# Patient Record
Sex: Male | Born: 2000 | Race: Black or African American | Hispanic: No | Marital: Single | Smoking: Never smoker
Health system: Southern US, Community
[De-identification: ages and names within clinical notes are randomized; demographics above are authoritative.]

## PROBLEM LIST (undated history)

## (undated) DIAGNOSIS — J45909 Unspecified asthma, uncomplicated: Secondary | ICD-10-CM

## (undated) DIAGNOSIS — J40 Bronchitis, not specified as acute or chronic: Secondary | ICD-10-CM

---
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
## 2019-08-11 ENCOUNTER — Emergency Department (HOSPITAL_COMMUNITY)
Admission: EM | Admit: 2019-08-11 | Discharge: 2019-08-11 | Disposition: A | Discharge status: Home / Self Care | Payer: 59 | Attending: Emergency Medicine | Admitting: Emergency Medicine

## 2019-08-11 ENCOUNTER — Emergency Department (HOSPITAL_COMMUNITY): Payer: 59

## 2019-08-11 ENCOUNTER — Other Ambulatory Visit: Payer: Self-pay

## 2019-08-11 DIAGNOSIS — J9801 Acute bronchospasm: Secondary | ICD-10-CM | POA: Insufficient documentation

## 2019-08-11 DIAGNOSIS — R0602 Shortness of breath: Secondary | ICD-10-CM | POA: Diagnosis not present

## 2019-08-11 DIAGNOSIS — J40 Bronchitis, not specified as acute or chronic: Secondary | ICD-10-CM | POA: Insufficient documentation

## 2019-08-11 DIAGNOSIS — R0789 Other chest pain: Secondary | ICD-10-CM | POA: Diagnosis present

## 2019-08-11 DIAGNOSIS — J209 Acute bronchitis, unspecified: Secondary | ICD-10-CM

## 2019-08-11 LAB — BASIC METABOLIC PANEL
Anion gap: 13 (ref 5–15)
BUN: 11 mg/dL (ref 6–20)
CO2: 24 mmol/L (ref 22–32)
Calcium: 9.2 mg/dL (ref 8.9–10.3)
Chloride: 102 mmol/L (ref 98–111)
Creatinine, Ser: 1.23 mg/dL (ref 0.61–1.24)
GFR calc Af Amer: >60 mL/min (ref >60)
GFR calc non Af Amer: >60 mL/min (ref >60)
Glucose, Bld: 90 mg/dL (ref 70–99)
Potassium: 4 mmol/L (ref 3.5–5.1)
Sodium: 139 mmol/L (ref 135–145)

## 2019-08-11 LAB — CBC
HCT: 52.3 % — ABNORMAL HIGH (ref 39.0–52.0)
Hemoglobin: 17.2 g/dL — ABNORMAL HIGH (ref 13.0–17.0)
MCH: 33 pg (ref 26.0–34.0)
MCHC: 32.9 g/dL (ref 30.0–36.0)
MCV: 100.2 fL — ABNORMAL HIGH (ref 80.0–100.0)
Platelets: 227 10*3/uL (ref 150–400)
RBC: 5.22 MIL/uL (ref 4.22–5.81)
RDW: 12.4 % (ref 11.5–15.5)
WBC: 10.8 10*3/uL — ABNORMAL HIGH (ref 4.0–10.5)
nRBC: 0 % (ref 0.0–0.2)

## 2019-08-11 LAB — TROPONIN I (HIGH SENSITIVITY): Troponin I (High Sensitivity): <2 ng/L (ref <18)

## 2019-08-11 MED ORDER — AEROCHAMBER Z-STAT PLUS/MEDIUM MISC
1.0000 | Freq: Once | Status: AC
  Administered 2019-08-11: 1
  Filled 2019-08-11: qty 1

## 2019-08-11 MED ORDER — PREDNISONE 20 MG PO TABS
60.0000 mg | ORAL_TABLET | Freq: Once | ORAL | Status: AC
  Administered 2019-08-11: 06:00:00 60 mg via ORAL
  Filled 2019-08-11: qty 3

## 2019-08-11 MED ORDER — SODIUM CHLORIDE 0.9% FLUSH: 3.0000 mL | Freq: Once | INTRAVENOUS | Status: DC

## 2019-08-11 MED ORDER — ALBUTEROL SULFATE HFA 108 (90 BASE) MCG/ACT IN AERS
6.0000 | INHALATION_SPRAY | Freq: Once | RESPIRATORY_TRACT | Status: AC
  Administered 2019-08-11: 06:00:00 6 via RESPIRATORY_TRACT
  Filled 2019-08-11: qty 6.7

## 2019-08-11 MED ORDER — AZITHROMYCIN 250 MG PO TABS: ORAL_TABLET | ORAL | 0 refills | Status: DC

## 2019-08-11 MED ORDER — PREDNISONE 20 MG PO TABS: ORAL_TABLET | ORAL | 0 refills | Status: DC

## 2019-08-11 NOTE — ED Triage Notes (Signed)
Arrived POV from home. Patient reports chest pain that worsens when taking a deep breath.

## 2019-08-11 NOTE — ED Provider Notes (Signed)
[place] DEPT Provider Note   CSN: [id number] Arrival date & time: 08/11/19  4098   Time seen 05:10 AM  History Chief Complaint  Patient presents with  . Chest Pain    Joshua Marks is a 19 y.o. male.  HPI   Patient states about 1130 last night he was in bed trying to go to sleep and he had some discomfort in the center of his chest when he breathes deep.  He feels like he is struggling to breathe.  He states he has had a mild cough for a few days.  He denies fever, sore throat, rhinorrhea, nausea, vomiting, or any known injury to his chest.  He does play football.  He denies any increased stress.  He states he is never had this before.  His mother is here with him and verifies he has never had asthma even as a child.  There is no family history of asthma.  No past medical history on file.  Patient Active Problem List   Diagnosis Date Noted  . Chest pain 02/03/2018  . Snoring 10/23/2012    No past surgical history on file.     No family history on file.  Social History   Tobacco Use  . Smoking status: Never Smoker  . Smokeless tobacco: Never Used  Substance Use Topics  . Alcohol use: Not on file  . Drug use: Not on file  College student  Home Medications Prior to Admission medications   Medication Sig Start Date End Date Taking? Authorizing Provider  azithromycin (ZITHROMAX) 250 MG tablet Take 2 po the first day then once a day for the next 4 days. 08/11/19   Rolland Porter, MD  predniSONE (DELTASONE) 20 MG tablet Take 3 po QD x 3d , then 2 po QD x 3d then 1 po QD x 3d 08/11/19   Rolland Porter, MD  none  Allergies    Patient has no known allergies.  Review of Systems   Review of Systems  All other systems reviewed and are negative.   Physical Exam Updated Vital Signs BP 138/78 (BP Location: Left Arm)   Pulse 75   Temp 97.8 F (36.6 C)   Resp 20   SpO2 100%   Vital signs normal    Physical Exam Vitals and nursing note  reviewed.  Constitutional:      Appearance: Normal appearance. He is normal weight.  HENT:     Head: Normocephalic and atraumatic.     Right Ear: External ear normal.     Left Ear: External ear normal.     Nose: Nose normal.     Mouth/Throat:     Mouth: Mucous membranes are moist.     Pharynx: No oropharyngeal exudate or posterior oropharyngeal erythema.  Eyes:     Extraocular Movements: Extraocular movements intact.     Conjunctiva/sclera: Conjunctivae normal.     Pupils: Pupils are equal, round, and reactive to light.  Cardiovascular:     Rate and Rhythm: Normal rate and regular rhythm.  Pulmonary:     Effort: Pulmonary effort is normal. Prolonged expiration present. No tachypnea, accessory muscle usage, respiratory distress or retractions.     Breath sounds: Wheezing and rhonchi present.  Musculoskeletal:     Cervical back: Normal range of motion.  Neurological:     Mental Status: He is alert.     ED Results / Procedures / Treatments   Labs (all labs ordered are listed, but only abnormal results are  displayed) Results for orders placed or performed during the hospital encounter of 08/11/19  Basic metabolic panel  Result Value Ref Range   Sodium 139 135 - 145 mmol/L   Potassium 4.0 3.5 - 5.1 mmol/L   Chloride 102 98 - 111 mmol/L   CO2 24 22 - 32 mmol/L   Glucose, Bld 90 70 - 99 mg/dL   BUN 11 6 - 20 mg/dL   Creatinine, Ser 5.46 0.61 - 1.24 mg/dL   Calcium 9.2 8.9 - 27.0 mg/dL   GFR calc non Af Amer >60 >60 mL/min   GFR calc Af Amer >60 >60 mL/min   Anion gap 13 5 - 15  CBC  Result Value Ref Range   WBC 10.8 (H) 4.0 - 10.5 K/uL   RBC 5.22 4.22 - 5.81 MIL/uL   Hemoglobin 17.2 (H) 13.0 - 17.0 g/dL   HCT 35.0 (H) 09.3 - 81.8 %   MCV 100.2 (H) 80.0 - 100.0 fL   MCH 33.0 26.0 - 34.0 pg   MCHC 32.9 30.0 - 36.0 g/dL   RDW 29.9 37.1 - 69.6 %   Platelets 227 150 - 400 K/uL   nRBC 0.0 0.0 - 0.2 %  Troponin I (High Sensitivity)  Result Value Ref Range   Troponin I  (High Sensitivity) <2 <18 ng/L   Laboratory interpretation all normal except mild leukocytosis    EKG EKG Interpretation  Date/Time:  Tuesday August 11 2019 05:23:59 EDT Ventricular Rate:  72 PR Interval:    QRS Duration: 118 QT Interval:  378 QTC Calculation: 414 R Axis:   82 Text Interpretation: Sinus rhythm Borderline short PR interval Incomplete right bundle branch block No significant change since last tracing 03 Feb 2018 Confirmed by Devoria Albe ([id number]) on 08/11/2019 5:34:31 AM   Radiology DG Chest 2 View  Result Date: 08/11/2019 CLINICAL DATA:  Chest pain and shortness of breath EXAM: CHEST - 2 VIEW COMPARISON:  None. FINDINGS: The heart size and mediastinal contours are within normal limits. Both lungs are clear. The visualized skeletal structures are unremarkable. IMPRESSION: No active cardiopulmonary disease. Electronically Signed   By: Jonna Clark M.D.   On: 08/11/2019 05:25    Procedures Procedures (including critical care time)  Medications Ordered in ED Medications  sodium chloride flush (NS) 0.9 % injection 3 mL (3 mLs Intravenous Not Given 08/11/19 0509)  aerochamber Z-Stat Plus/medium 1 each (has no administration in time range)  albuterol (VENTOLIN HFA) 108 (90 Base) MCG/ACT inhaler 6 puff (6 puffs Inhalation Given 08/11/19 0538)  predniSONE (DELTASONE) tablet 60 mg (60 mg Oral Given 08/11/19 0537)    ED Course  I have reviewed the triage vital signs and the nursing notes.  Pertinent labs & imaging results that were available during my care of the patient were reviewed by me and considered in my medical decision making (see chart for details).    MDM Rules/Calculators/A&P                      Patient was given albuterol inhaler 6 puffs for his wheezing.  After reviewing his chest x-ray he was given prednisone 60 mg orally.  Recheck at 6:30 AM patient is sleeping in no distress.  When I listen to him now the wheezing and rhonchi is gone.  He states he feels  much better.  Mother would like him to be tested for Covid while he is here.  They were given a spacer to use with the inhaler when  he goes home.   Final Clinical Impression(s) / ED Diagnoses Final diagnoses:  Bronchitis with bronchospasm    Rx / DC Orders ED Discharge Orders         Ordered    predniSONE (DELTASONE) 20 MG tablet     08/11/19 0640    azithromycin (ZITHROMAX) 250 MG tablet     08/11/19 0640          Plan discharge  Devoria Albe, MD, Concha Pyo, MD 08/11/19 [phone]

## 2019-08-11 NOTE — Discharge Instructions (Addendum)
Use the inhaler 2 puffs every 4-6 hours as needed for wheezing or shortness of breath.  Use the spacer to make it more effective.  Take the prednisone until gone.  Recheck if you get a high fever or you get worse.

## 2019-10-25 ENCOUNTER — Emergency Department (HOSPITAL_COMMUNITY): Payer: No Typology Code available for payment source

## 2019-10-25 ENCOUNTER — Emergency Department (HOSPITAL_COMMUNITY)
Admission: EM | Admit: 2019-10-25 | Discharge: 2019-10-25 | Disposition: A | Discharge status: Home / Self Care | Payer: No Typology Code available for payment source | Attending: Emergency Medicine | Admitting: Emergency Medicine

## 2019-10-25 ENCOUNTER — Encounter (HOSPITAL_COMMUNITY): Payer: Self-pay | Admitting: Emergency Medicine

## 2019-10-25 ENCOUNTER — Other Ambulatory Visit: Payer: Self-pay

## 2019-10-25 DIAGNOSIS — J4520 Mild intermittent asthma, uncomplicated: Secondary | ICD-10-CM | POA: Insufficient documentation

## 2019-10-25 DIAGNOSIS — R0602 Shortness of breath: Secondary | ICD-10-CM | POA: Diagnosis present

## 2019-10-25 DIAGNOSIS — R0789 Other chest pain: Secondary | ICD-10-CM | POA: Insufficient documentation

## 2019-10-25 HISTORY — DX: Bronchitis, not specified as acute or chronic: J40

## 2019-10-25 LAB — CBC
HCT: 52.7 % — ABNORMAL HIGH (ref 39.0–52.0)
Hemoglobin: 17.4 g/dL — ABNORMAL HIGH (ref 13.0–17.0)
MCH: 32.2 pg (ref 26.0–34.0)
MCHC: 33 g/dL (ref 30.0–36.0)
MCV: 97.6 fL (ref 80.0–100.0)
Platelets: 196 10*3/uL (ref 150–400)
RBC: 5.4 MIL/uL (ref 4.22–5.81)
RDW: 12.4 % (ref 11.5–15.5)
WBC: 9.8 10*3/uL (ref 4.0–10.5)
nRBC: 0 % (ref 0.0–0.2)

## 2019-10-25 LAB — TROPONIN I (HIGH SENSITIVITY): Troponin I (High Sensitivity): 3 ng/L (ref <18)

## 2019-10-25 LAB — BASIC METABOLIC PANEL
Anion gap: 11 (ref 5–15)
BUN: 13 mg/dL (ref 6–20)
CO2: 25 mmol/L (ref 22–32)
Calcium: 9.3 mg/dL (ref 8.9–10.3)
Chloride: 103 mmol/L (ref 98–111)
Creatinine, Ser: 1.34 mg/dL — ABNORMAL HIGH (ref 0.61–1.24)
GFR calc Af Amer: >60 mL/min (ref >60)
GFR calc non Af Amer: >60 mL/min (ref >60)
Glucose, Bld: 92 mg/dL (ref 70–99)
Potassium: 4.2 mmol/L (ref 3.5–5.1)
Sodium: 139 mmol/L (ref 135–145)

## 2019-10-25 MED ORDER — SODIUM CHLORIDE 0.9% FLUSH: 3.0000 mL | Freq: Once | INTRAVENOUS | Status: DC

## 2019-10-25 MED ORDER — PREDNISONE 20 MG PO TABS
ORAL_TABLET | ORAL | 0 refills | Status: DC
Start: 2019-10-25 — End: 2020-05-09

## 2019-10-25 MED ORDER — PREDNISONE 20 MG PO TABS
60.0000 mg | ORAL_TABLET | Freq: Once | ORAL | Status: AC
  Administered 2019-10-25: 60 mg via ORAL
  Filled 2019-10-25: qty 3

## 2019-10-25 MED ORDER — ALBUTEROL SULFATE HFA 108 (90 BASE) MCG/ACT IN AERS
6.0000 | INHALATION_SPRAY | Freq: Once | RESPIRATORY_TRACT | Status: AC
  Administered 2019-10-25: 6 via RESPIRATORY_TRACT
  Filled 2019-10-25: qty 6.7

## 2019-10-25 NOTE — ED Triage Notes (Signed)
C/o cough, SOB and pain to center of chest since last night.

## 2019-10-25 NOTE — ED Provider Notes (Signed)
   19 year old male presents with past 2days prior.  No known history of asthma.  No fever chills runny nose sneezing sore throat loss of taste or smell.  He has had his Covid vaccination.  He has no significant cardiac disease.  5:10 PM EKG without concerning changes.  Creatinine is 1.3.,  It appears patient has had abnormal renal function from prior lab work as well.  I discussed with patient the importance of follow-up with a nephrologist for further management and evaluation of his impaired kidney function.  Negative troponin, chest x-ray unremarkable, patient without hypoxia he is stable for discharge.  BP 140/81   Pulse 78   Temp 98.2 F (36.8 C)   Resp 18   Ht 6\' 2"  (1.88 m)   Wt 88.5 kg   SpO2 95%   BMI 25.04 kg/m   Results for orders placed or performed during the hospital encounter of 10/25/19  Basic metabolic panel  Result Value Ref Range   Sodium 139 135 - 145 mmol/L   Potassium 4.2 3.5 - 5.1 mmol/L   Chloride 103 98 - 111 mmol/L   CO2 25 22 - 32 mmol/L   Glucose, Bld 92 70 - 99 mg/dL   BUN 13 6 - 20 mg/dL   Creatinine, Ser 12/25/19 (H) 0.61 - 1.24 mg/dL   Calcium 9.3 8.9 - 9.81 mg/dL   GFR calc non Af Amer >60 >60 mL/min   GFR calc Af Amer >60 >60 mL/min   Anion gap 11 5 - 15  CBC  Result Value Ref Range   WBC 9.8 4.0 - 10.5 K/uL   RBC 5.40 4.22 - 5.81 MIL/uL   Hemoglobin 17.4 (H) 13.0 - 17.0 g/dL   HCT 19.1 (H) 47.8 - 29.5 %   MCV 97.6 80.0 - 100.0 fL   MCH 32.2 26.0 - 34.0 pg   MCHC 33.0 30.0 - 36.0 g/dL   RDW 62.1 30.8 - 65.7 %   Platelets 196 150 - 400 K/uL   nRBC 0.0 0.0 - 0.2 %  Troponin I (High Sensitivity)  Result Value Ref Range   Troponin I (High Sensitivity) 3 <18 ng/L   DG Chest 2 View  Result Date: 10/25/2019 CLINICAL DATA:  Chest pain, shortness of breath EXAM: CHEST - 2 VIEW COMPARISON:  08/11/2019 FINDINGS: Normal heart size, mediastinal contours, and pulmonary vascularity. Lungs clear. No pleural effusion or pneumothorax. Bones unremarkable.  IMPRESSION: Normal exam. Electronically Signed   By: 08/13/2019 M.D.   On: 10/25/2019 13:44      12/25/2019, PA-C 10/25/19 1710    12/25/19, MD 10/25/19 2101

## 2019-10-25 NOTE — ED Notes (Signed)
Patient verbalizes understanding of discharge instructions. Opportunity for questioning and answers were provided. Armband removed by staff, pt discharged from ED.  

## 2019-10-25 NOTE — ED Provider Notes (Signed)
[facility] EMERGENCY DEPARTMENT Provider Note   CSN: [id number] Arrival date & time: 10/25/19  1252     History Chief Complaint  Patient presents with  . Shortness of Breath  . Chest Pain    Joshua Marks is a 19 y.o. male with past medical history significant for bronchitis presents to emergency department today with chief complaint of progressively worsening shortness of breath and chest pain x2 days.  Patient states the pain started last night in his chest while he was sitting down at rest.  He states the pain is located in the center of his chest.  He states pain has been constant and is an aching sensation.  He describes the shortness of breath as feeling as if he is struggling to breathe.  He hears wheezing.  He has not tried any medications for symptoms prior to arrival.  He denies any fever, chills, sore throat, rhinorrhea, cough, hemoptysis, nausea, vomiting, loss of sense of taste or smell, rash.  He denies any sick contacts.  Patient admits to occasional tobacco abuse and states he is not an every day smoker.  He denies any history of asthma.  Also denies any family history of MI. Patient does state he was seen for similar symptoms on 08/11/2019.  Past Medical History:  Diagnosis Date  . Bronchitis     Patient Active Problem List   Diagnosis Date Noted  . Chest pain 02/03/2018  . Snoring 10/23/2012    History reviewed. No pertinent surgical history.     No family history on file.  Social History   Tobacco Use  . Smoking status: Never Smoker  . Smokeless tobacco: Never Used  Substance Use Topics  . Alcohol use: Never  . Drug use: Yes    Types: Marijuana    Home Medications Prior to Admission medications   Medication Sig Start Date End Date Taking? Authorizing Provider  azithromycin (ZITHROMAX) 250 MG tablet Take 2 po the first day then once a day for the next 4 days. 08/11/19   Devoria Albe, MD  predniSONE (DELTASONE) 20 MG tablet Take 3 po QD x  3d , then 2 po QD x 3d then 1 po QD x 3d 08/11/19   Devoria Albe, MD    Allergies    Patient has no known allergies.  Review of Systems   Review of Systems  All other systems are reviewed and are negative for acute change except as noted in the HPI.   Physical Exam Updated Vital Signs BP 136/84   Pulse 66   Temp 98.2 F (36.8 C)   Resp 19   Ht 6\' 2"  (1.88 m)   Wt 88.5 kg   SpO2 95%   BMI 25.04 kg/m   Physical Exam Vitals and nursing note reviewed.  Constitutional:      General: He is not in acute distress.    Appearance: He is not ill-appearing.  HENT:     Head: Normocephalic and atraumatic.     Right Ear: Tympanic membrane and external ear normal.     Left Ear: Tympanic membrane and external ear normal.     Nose: Nose normal.     Mouth/Throat:     Mouth: Mucous membranes are moist.     Pharynx: Oropharynx is clear.  Eyes:     General: No scleral icterus.       Right eye: No discharge.        Left eye: No discharge.     Extraocular  Movements: Extraocular movements intact.     Conjunctiva/sclera: Conjunctivae normal.     Pupils: Pupils are equal, round, and reactive to light.  Neck:     Vascular: No JVD.  Cardiovascular:     Rate and Rhythm: Normal rate and regular rhythm.     Pulses: Normal pulses.          Radial pulses are 2+ on the right side and 2+ on the left side.     Heart sounds: Normal heart sounds.  Pulmonary:     Comments: Expiratory wheeze heard in all lung fields.  Symmetric chest rise.  No rales or rhonchi heard.  Patient is talking in full sentences.  Oxygen saturation is 95% on room air.  Symmetric chest rise. Chest:     Chest wall: No tenderness.  Abdominal:     Comments: Abdomen is soft, non-distended, and non-tender in all quadrants. No rigidity, no guarding. No peritoneal signs.  Musculoskeletal:        General: Normal range of motion.     Cervical back: Normal range of motion.     Right lower leg: No edema.     Left lower leg: No edema.    Skin:    General: Skin is warm and dry.     Capillary Refill: Capillary refill takes less than 2 seconds.  Neurological:     Mental Status: He is oriented to person, place, and time.     GCS: GCS eye subscore is 4. GCS verbal subscore is 5. GCS motor subscore is 6.     Comments: Fluent speech, no facial droop.  Psychiatric:        Behavior: Behavior normal.     ED Results / Procedures / Treatments   Labs (all labs ordered are listed, but only abnormal results are displayed) Labs Reviewed  CBC - Abnormal; Notable for the following components:      Result Value   Hemoglobin 17.4 (*)    HCT 52.7 (*)    All other components within normal limits  BASIC METABOLIC PANEL  TROPONIN I (HIGH SENSITIVITY)  TROPONIN I (HIGH SENSITIVITY)    EKG EKG Interpretation  Date/Time:  Sunday October 25 2019 13:03:56 EDT Ventricular Rate:  72 PR Interval:  126 QRS Duration: 102 QT Interval:  360 QTC Calculation: 394 R Axis:   93 Text Interpretation: Normal sinus rhythm with sinus arrhythmia Rightward axis Incomplete right bundle branch block Borderline ECG Confirmed by Carmin Muskrat [phone]) on 10/25/2019 3:44:58 PM   Radiology DG Chest 2 View  Result Date: 10/25/2019 CLINICAL DATA:  Chest pain, shortness of breath EXAM: CHEST - 2 VIEW COMPARISON:  08/11/2019 FINDINGS: Normal heart size, mediastinal contours, and pulmonary vascularity. Lungs clear. No pleural effusion or pneumothorax. Bones unremarkable. IMPRESSION: Normal exam. Electronically Signed   By: Lavonia Dana M.D.   On: 10/25/2019 13:44    Procedures Procedures (including critical care time)  Medications Ordered in ED Medications  sodium chloride flush (NS) 0.9 % injection 3 mL (has no administration in time range)  predniSONE (DELTASONE) tablet 60 mg (has no administration in time range)  albuterol (VENTOLIN HFA) 108 (90 Base) MCG/ACT inhaler 6 puff (has no administration in time range)    ED Course  I have reviewed the triage  vital signs and the nursing notes.  Pertinent labs & imaging results that were available during my care of the patient were reviewed by me and considered in my medical decision making (see chart for details).  MDM Rules/Calculators/A&P                      History provided by patient with additional history obtained from chart review.    19 year old male presenting with chest pain shortness of breath.Vitals show he is afebrile, normotensive, no tachycardia or hypoxia.  On exam expiratory wheeze heard in all lung fields.  He does have normal work of breathing with symmetric chest rise.  He is speaking in full sentences, no obvious respiratory distress.  Oxygen saturation is 95% on room air during exam. Chest pain work-up was initiated in triage.  Chest x-ray was performed.  I viewed imaging which shows no acute infectious processes.  Labs show no leukocytosis, no anemia.  Troponin, BMP are pending.  EKG shows no ischemic changes compared to prior.  Will give p.o. prednisone and albuterol with plan to ambulate while checking oxygen saturation..  Patient denies family history of MI.  Presentation is not suggestive of ACS.  Also not suggestive of PE.  Patient care transferred to B. Laveda Norman PA-C at the end of my shift pending labs and reassessment. Patient presentation, ED course, and plan of care discussed with review of all pertinent labs and imaging. Please see his note for further details regarding further ED course and disposition. Anticipate discharge home if ambulatory with hypoxia and symptoms improve.   Portions of this note were generated with Scientist, clinical (histocompatibility and immunogenetics). Dictation errors may occur despite best attempts at proofreading.   Final Clinical Impression(s) / ED Diagnoses Final diagnoses:  None    Rx / DC Orders ED Discharge Orders    None       Kathyrn Lass 10/25/19 1620    Gerhard Munch, MD 10/25/19 2101

## 2019-10-25 NOTE — Discharge Instructions (Addendum)
No shortness of breath and wheezing may be due to asthma.  Please use albuterol inhaler 2 puffs every 4 hours as needed for shortness of breath.  Take steroids as prescribed.  Follow-up with your primary care provider for management.  Return if you have any concerns.  Your Kidney function is abnormal, it appears to show you have impaired kidney function from prior lab works as well.  Please consider follow-up with nephrologist for evaluation.

## 2020-05-09 ENCOUNTER — Encounter: Payer: Self-pay | Admitting: Family Medicine

## 2020-05-09 ENCOUNTER — Ambulatory Visit (INDEPENDENT_AMBULATORY_CARE_PROVIDER_SITE_OTHER): Payer: No Typology Code available for payment source | Admitting: Family Medicine

## 2020-05-09 ENCOUNTER — Other Ambulatory Visit: Payer: Self-pay

## 2020-05-09 DIAGNOSIS — J45909 Unspecified asthma, uncomplicated: Secondary | ICD-10-CM | POA: Insufficient documentation

## 2020-05-09 DIAGNOSIS — J4520 Mild intermittent asthma, uncomplicated: Secondary | ICD-10-CM

## 2020-05-09 DIAGNOSIS — R7989 Other specified abnormal findings of blood chemistry: Secondary | ICD-10-CM | POA: Diagnosis not present

## 2020-05-09 MED ORDER — ALBUTEROL SULFATE HFA 108 (90 BASE) MCG/ACT IN AERS: 2.0000 | INHALATION_SPRAY | RESPIRATORY_TRACT | 3 refills | Status: DC | PRN

## 2020-05-09 NOTE — Assessment & Plan Note (Signed)
Based on clinical history of wheezing, no cough, SOB with exertion without evidence of cardiac history or murmur on exam and improved respiratory symptoms with albuterol inhaler.  - prescribed albuterol inhaler for use PRN  - patient to follow up in 6 weeks  - scheduled for spirometry testing with Dr. Raymondo Band  - confirmed patient has received two doses of COVID vaccine  - precautions for repeated use of albuterol in setting of SOB/dyspnea/chest tightness that patient will need to be seen in ED

## 2020-05-09 NOTE — Progress Notes (Signed)
    SUBJECTIVE:   CHIEF COMPLAINT / HPI: asthma   Patient reports that he has some central chest pain and has some trouble breathing while trying to sleep. He denies any presence of chest pain/tightness today. This has been occurring intermittently since he was seen in the ED in June of this year. He reports being given an inhaler but uses it infrequently. He does have some SOB at times especially when playing sports or being active.   Sometimes has some wheezing  Reports smoking marijuana 3-4x per week  Denies tobacco use or vaping  Denies coughing, no dysphagia or GERD symptoms Never had spirometry testing  No history of passing out while playing sports and reports that his breathing feels better after using his albuterol and he is able to quickly return to play after using it  Denies any association of these symptoms with recent illness   Elevated creatinine  Patient noted to have elevated creatinine ranging from 1.2-1.3 in last three years. Specific mention of this during ED visit in June. Patient declines lab work today to check for updated value. Discussed importance and possible increased muscle mass as possible contributor to elevated level. Patient denies hematuria, oliguria, flank/CVA pain.   PERTINENT  PMH / PSH: Reactive Airway   OBJECTIVE:   BP 118/60   Pulse (!) 57   Ht 6' (1.829 m)   Wt 189 lb 6.4 oz (85.9 kg)   SpO2 99%   BMI 25.69 kg/m    General: male appearing stated age in no acute distress Cardio: Normal S1 and S2, no S3 or S4. Rhythm is regular. No murmurs or rubs.  Bilateral radial pulses palpable Pulm: Clear to auscultation bilaterally, no crackles, wheezing, or diminished breath sounds. Normal respiratory effort, stable on RA Abdomen: Bowel sounds normal. Abdomen soft and non-tender.  Extremities: No peripheral edema. Warm/ well perfused.    ASSESSMENT/PLAN:   Asthma Based on clinical history of wheezing, no cough, SOB with exertion without evidence of  cardiac history or murmur on exam and improved respiratory symptoms with albuterol inhaler.  - prescribed albuterol inhaler for use PRN  - patient to follow up in 6 weeks  - scheduled for spirometry testing with Dr. Raymondo Band  - confirmed patient has received two doses of COVID vaccine  - precautions for repeated use of albuterol in setting of SOB/dyspnea/chest tightness that patient will need to be seen in ED   Elevated serum creatinine Chart review with creatinine elevated from 1.2-1.3. Normal GFR. No hematuria/oliguria or signs of pyelonephritis. Potentially due to increased muscle mass as patient is of athletic build and lifts weights often.  - declines BMP & U/A today      Ronnald Ramp, MD [facility]

## 2020-05-09 NOTE — Assessment & Plan Note (Signed)
Chart review with creatinine elevated from 1.2-1.3. Normal GFR. No hematuria/oliguria or signs of pyelonephritis. Potentially due to increased muscle mass as patient is of athletic build and lifts weights often.  - declines BMP & U/A today

## 2020-05-09 NOTE — Patient Instructions (Addendum)
I have prescribed an albuterol inhaler for you to use before exercise and when you have increased wheezing.   I have scheduled you for the breathing test to see if you lungs show evidence of changes consistent with asthma.     Asthma and Physical Activity Physical activity is an important part of a healthy lifestyle. If you have asthma, it is important to exercise because physical activity can help you to:  Control your asthma.  Maintain your weight or lose weight.  Increase your energy.  Decrease stress and anxiety.  Lower your risk of getting sick.  Improve your heart health. However, asthma symptoms can flare up when you are physically active or exercising. You can learn how to control your asthma and prevent symptoms during exercise. This will help you to remain physically active. How can asthma affect my ability to be physically active? When you have asthma, physical activity can cause you to have symptoms such as:  Wheezing. This may sound like whistling while breathing.  A feeling of tightness in the chest.  Sore throat.  Coughing.  Shortness of breath.  Tiredness (fatigue) with minimal activity.  Increased sputum production.  Chest pain. What actions can I take to prevent asthma problems during physical activity? Pulmonary rehabilitation Enroll in a pulmonary rehabilitation program. Benefits of this type of program include:  Education on lung diseases.  Classes that teach you how to exercise and be more active while decreasing your shortness of breath.  A group setting that allows you to talk with others who have asthma. Asthma action plan Follow the asthma action plan set by your health care provider. Your personal asthma plan may include:  Taking your medicines as told by your health care provider.  Avoiding your asthma triggers, except physical activity. Triggers may include cold air, dust, pollen, pet dander, and air pollution.  Tracking your asthma  control.  Using a peak flow meter.  Being aware of worsening symptoms.  Knowing when to seek emergency care. Proper breathing During exercise, follow these tips for proper breathing:  Breathe in before starting the exercise and breathe out during the hardest part of the exercise.  Take slow breaths.  Pace yourself and do not try to go too fast.  While breathing out, purse your lips. Before beginning any exercise program or new activity, talk with your health care provider. Medicines If physical activity triggers your asthma, your health care provider may order the following medicines:  A rescue inhaler (short-acting beta2-agonist) for you to use shortly before physical activity or exercise. Its effects may reduce exercise-related symptoms for 2-3 hours.  A long-acting beta2-agonist that can offer up to 12 hours of relief if taken daily.  Leukotriene modifiers. These pills are taken several hours before physical activity or exercise to help prevent asthma symptoms that are caused by exercise.  Long-term control medicines. These will be given if you have severe or frequent asthma symptoms during or after exercise. These symptoms may also mean that your asthma is not well controlled.  General information  Exercise indoors when the air is dry or during allergy season.  Try to breathe in warm, moist air by wearing a scarf over your nose and mouth or breathing only through your nose.  Spend a few minutes warming up before your workout.  Cool down after exercise. What should I do if my asthma symptoms get worse? Contact your health care provider if your asthma symptoms are getting worse. Your asthma is getting worse if:  You have symptoms more often.  Your symptoms are more severe.  Your symptoms get worse at night and make you lose sleep.  Your peak flow number is lower than your personal best or changes a lot from day to day.  Your asthma medicines do not work as well as  they used to.  You use your rescue inhaler more often. If you use your rescue inhaler more than 2 days a week, your asthma is not well controlled.  You go to the emergency room or see your health care provider because of an asthma attack. Where to find support  Ask your health care provider about signing up for a pulmonary rehabilitation program.  Ask your health care provider about asthma support groups.  Visit your local community health department.  Check out local hospitals' community health programs. Where can I get more information?  Your health care provider.  American Lung Association: lung.org  National Heart, Lung, and Blood Institute: [employer] Contact a health care provider if:  You have trouble walking and talking because you are out of breath. Get help right away if:  Your lips or fingernails are blue.  You are not able to breathe or catch your breath. Summary  Physical activity is an important part of a healthy lifestyle. However, if you have asthma, your symptoms can flare up during exercise or physical activity.  You can prevent problems during physical activity by doing pulmonary rehabilitation, following an asthma action plan, doing proper breathing, and using medicines.  Talk with your health care provider before starting any exercise program or new activity. This information is not intended to replace advice given to you by your health care provider. Make sure you discuss any questions you have with your health care provider. Document Revised: 08/26/2018 Document Reviewed: 07/23/2017 Elsevier Patient Education  2020 [employer].

## 2020-05-26 ENCOUNTER — Ambulatory Visit: Payer: No Typology Code available for payment source | Admitting: Pharmacist

## 2020-09-23 ENCOUNTER — Encounter: Payer: Self-pay | Admitting: Family Medicine

## 2020-09-23 ENCOUNTER — Ambulatory Visit (INDEPENDENT_AMBULATORY_CARE_PROVIDER_SITE_OTHER): Payer: No Typology Code available for payment source | Admitting: Family Medicine

## 2020-09-23 ENCOUNTER — Other Ambulatory Visit (HOSPITAL_COMMUNITY)
Admission: RE | Admit: 2020-09-23 | Discharge: 2020-09-23 | Disposition: A | Discharge status: Home / Self Care | Payer: No Typology Code available for payment source | Source: Ambulatory Visit | Attending: Family Medicine | Admitting: Family Medicine

## 2020-09-23 ENCOUNTER — Other Ambulatory Visit: Payer: Self-pay

## 2020-09-23 VITALS — BP 120/72 | HR 59 | Ht 73.5 in | Wt 194.4 lb

## 2020-09-23 DIAGNOSIS — R7989 Other specified abnormal findings of blood chemistry: Secondary | ICD-10-CM

## 2020-09-23 DIAGNOSIS — Z7251 High risk heterosexual behavior: Secondary | ICD-10-CM | POA: Diagnosis not present

## 2020-09-23 DIAGNOSIS — Z7689 Persons encountering health services in other specified circumstances: Secondary | ICD-10-CM | POA: Diagnosis present

## 2020-09-23 DIAGNOSIS — J4520 Mild intermittent asthma, uncomplicated: Secondary | ICD-10-CM

## 2020-09-23 NOTE — Patient Instructions (Signed)
It was a pleasure to see you today!  Thank you for choosing [facility] for your primary care.   Joshua Marks was seen for annual physical.   Our plans for today were:  STI screening: We will complete STI screening today with blood test and urine tests. I will let you know of any abnormal results.   Healthcare maintenance: we will also do blood work to check your kidney levels as they were elevated slightly last year.   To keep you healthy, please keep in mind the following health maintenance items that you are due for:   1. Tetanus Vaccine    You should return to our clinic as needed.   Best Wishes,   Dr. Neita Garnet

## 2020-09-23 NOTE — Progress Notes (Signed)
    SUBJECTIVE:   CHIEF COMPLAINT / HPI: physical and STI screening   Patient reports that he has had a new male sexual partner and did not use barrier contraception. He denies any penile dyscharge, dysuria, fever, chills, joint pain, or new genital/groin rashes. Patient reports feeling well and having no complaints today but would like to have STI testing for reassurance.   Asthma Patient denies any nighttime cough. He uses rescue inhaler after playing football or basketball occasionally. He denies presence of wheezing or coughing.    PERTINENT  PMH / PSH:   OBJECTIVE:   BP 120/72   Pulse (!) 59   Ht 6' 1.5" (1.867 m)   Wt 194 lb 6.4 oz (88.2 kg)   SpO2 95%   BMI 25.30 kg/m   General: male appearing stated age in no acute distress HEENT: MMM, Neck non-tender without lymphadenopathy, masses or thyromegaly Cardio: Normal S1 and S2, no S3 or S4. Rhythm is regular. No murmurs or rubs.  Bilateral radial pulses palpable Pulm: Clear to auscultation bilaterally, no crackles, wheezing, or diminished breath sounds. Normal respiratory effort, stable on RA Abdomen: Bowel sounds normal. Abdomen soft and non-tender.  Extremities: No peripheral edema. Warm/ well perfused.   ASSESSMENT/PLAN:   Asthma Refilled albuterol inhaler  Uses intermittently with exercised induced symptoms   Elevated serum creatinine Will recheck BMP for creatinine today   Unprotected sex HIV  RPR  Hep C  Urine GC       Ronnald Ramp, MD [facility]

## 2020-09-23 NOTE — Assessment & Plan Note (Signed)
Will recheck BMP for creatinine today

## 2020-09-23 NOTE — Assessment & Plan Note (Signed)
Refilled albuterol inhaler  Uses intermittently with exercised induced symptoms

## 2020-09-23 NOTE — Assessment & Plan Note (Signed)
HIV  RPR  Hep C  Urine GC

## 2020-09-24 LAB — BASIC METABOLIC PANEL
BUN/Creatinine Ratio: 11 (ref 9–20)
BUN: 14 mg/dL (ref 6–20)
CO2: 21 mmol/L (ref 20–29)
Calcium: 9.3 mg/dL (ref 8.7–10.2)
Chloride: 105 mmol/L (ref 96–106)
Creatinine, Ser: 1.3 mg/dL — ABNORMAL HIGH (ref 0.76–1.27)
Glucose: 84 mg/dL (ref 65–99)
Potassium: 4.7 mmol/L (ref 3.5–5.2)
Sodium: 142 mmol/L (ref 134–144)
eGFR: 81 mL/min/{1.73_m2} (ref >59)

## 2020-09-24 LAB — RPR: RPR Ser Ql: NONREACTIVE

## 2020-09-24 LAB — HEPATITIS C ANTIBODY: Hep C Virus Ab: <0.1 s/co ratio (ref 0.0–0.9)

## 2020-09-24 LAB — HIV ANTIBODY (ROUTINE TESTING W REFLEX): HIV Screen 4th Generation wRfx: NONREACTIVE

## 2020-09-25 MED ORDER — ALBUTEROL SULFATE HFA 108 (90 BASE) MCG/ACT IN AERS: 2.0000 | INHALATION_SPRAY | RESPIRATORY_TRACT | 3 refills | Status: DC | PRN

## 2020-09-26 ENCOUNTER — Encounter: Payer: Self-pay | Admitting: Family Medicine

## 2020-09-26 LAB — URINE CYTOLOGY ANCILLARY ONLY
Chlamydia: NEGATIVE
Comment: NEGATIVE
Comment: NORMAL
Neisseria Gonorrhea: NEGATIVE

## 2020-12-07 ENCOUNTER — Ambulatory Visit: Payer: No Typology Code available for payment source

## 2020-12-08 ENCOUNTER — Ambulatory Visit: Payer: No Typology Code available for payment source

## 2020-12-09 ENCOUNTER — Other Ambulatory Visit: Payer: Self-pay

## 2020-12-09 ENCOUNTER — Ambulatory Visit (INDEPENDENT_AMBULATORY_CARE_PROVIDER_SITE_OTHER): Payer: No Typology Code available for payment source

## 2020-12-09 DIAGNOSIS — Z23 Encounter for immunization: Secondary | ICD-10-CM | POA: Diagnosis not present

## 2020-12-09 DIAGNOSIS — Z111 Encounter for screening for respiratory tuberculosis: Secondary | ICD-10-CM

## 2020-12-12 ENCOUNTER — Ambulatory Visit (INDEPENDENT_AMBULATORY_CARE_PROVIDER_SITE_OTHER): Payer: No Typology Code available for payment source

## 2020-12-12 ENCOUNTER — Other Ambulatory Visit: Payer: Self-pay

## 2020-12-12 DIAGNOSIS — Z111 Encounter for screening for respiratory tuberculosis: Secondary | ICD-10-CM

## 2020-12-12 LAB — TB SKIN TEST
Induration: 0 mm
TB Skin Test: NEGATIVE

## 2020-12-12 NOTE — Progress Notes (Signed)
Patient is here for a PPD placement.  PPD placed in left forearm @ 1520.  Patient will return 12/12/20 to have PPD read.   Patient also requests updated vaccinations. Per [facility], patient is due for Tdap vaccination. Administered in RD, site unremarkable, patient tolerated injection well.   Veronda Prude, RN

## 2020-12-13 NOTE — Progress Notes (Signed)
Patient is here for a PPD read.  It was placed on 12/09/2020 in the left forearm @ 3:20 pm.    PPD RESULTS:  Result: negative Induration: 0 mm  Letter created and given to patient for documentation purposes. Veronda Prude, RN

## 2020-12-16 ENCOUNTER — Telehealth: Payer: Self-pay | Admitting: Family Medicine

## 2020-12-16 NOTE — Telephone Encounter (Signed)
Patients mother is calling and would like to schedule an appointment for patient to have sickle cell testing for football. His results are not in state records.  Molly Maduro said we could place lab but will need for Dr. Neita Garnet to place order before scheduling.   Please call mother at [phone] once order has been placed so that she can schedule.

## 2020-12-20 ENCOUNTER — Other Ambulatory Visit: Payer: Self-pay | Admitting: Family Medicine

## 2020-12-20 DIAGNOSIS — Z130 Encounter for screening for diseases of the blood and blood-forming organs and certain disorders involving the immune mechanism: Secondary | ICD-10-CM

## 2020-12-20 NOTE — Telephone Encounter (Signed)
Orders placed for sickle cell screening per mother's request.  Attempted to call with no answer.  Left voicemail for patient's mother to call clinic to schedule lab appointment.  Ronnald Ramp, MD [facility], PGY-3 [phone]

## 2020-12-21 ENCOUNTER — Other Ambulatory Visit: Payer: No Typology Code available for payment source

## 2020-12-22 ENCOUNTER — Other Ambulatory Visit: Payer: No Typology Code available for payment source

## 2020-12-26 ENCOUNTER — Other Ambulatory Visit: Payer: No Typology Code available for payment source

## 2020-12-26 ENCOUNTER — Other Ambulatory Visit: Payer: Self-pay

## 2020-12-26 DIAGNOSIS — Z130 Encounter for screening for diseases of the blood and blood-forming organs and certain disorders involving the immune mechanism: Secondary | ICD-10-CM

## 2020-12-28 ENCOUNTER — Encounter: Payer: Self-pay | Admitting: Family Medicine

## 2020-12-28 LAB — CBC
Hematocrit: 45.2 % (ref 37.5–51.0)
Hemoglobin: 15.8 g/dL (ref 13.0–17.7)
MCH: 32.3 pg (ref 26.6–33.0)
MCHC: 35 g/dL (ref 31.5–35.7)
MCV: 92 fL (ref 79–97)
Platelets: 197 10*3/uL (ref 150–450)
RBC: 4.89 x10E6/uL (ref 4.14–5.80)
RDW: 11.3 % — ABNORMAL LOW (ref 11.6–15.4)
WBC: 5 10*3/uL (ref 3.4–10.8)

## 2020-12-28 LAB — HGB FRACTIONATION CASCADE
Hgb A2: 2.3 % (ref 1.8–3.2)
Hgb A: 97.7 % (ref 96.4–98.8)
Hgb F: 0 % (ref 0.0–2.0)
Hgb S: 0 %

## 2020-12-28 LAB — SICKLE CELL SCREEN: Sickle Cell Screen: NEGATIVE

## 2021-03-28 ENCOUNTER — Other Ambulatory Visit: Payer: Self-pay

## 2021-03-28 ENCOUNTER — Ambulatory Visit (INDEPENDENT_AMBULATORY_CARE_PROVIDER_SITE_OTHER): Payer: 59 | Admitting: Family Medicine

## 2021-03-28 DIAGNOSIS — L639 Alopecia areata, unspecified: Secondary | ICD-10-CM | POA: Diagnosis not present

## 2021-03-28 NOTE — Assessment & Plan Note (Signed)
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  Discussed possible injections or high dose topical steroids but given risk of hypopigmentation decided to observe but to contact me if any changes

## 2021-03-28 NOTE — Patient Instructions (Addendum)
Good to see you today - Thank you for coming in  Things we discussed today:  Alopecia Areata  - let me know if getting worse or have another patch  See handout

## 2021-03-28 NOTE — Progress Notes (Signed)
    SUBJECTIVE:   CHIEF COMPLAINT / HPI:   Hair Loss Focal area of hair loss behind R ear.  Present for a month or so.  Doesn't seem to be changing.  No redness or scaling or itching. No history of chronic diseases or other skin rash or mouth sores or arthritis   OBJECTIVE:   BP 120/70   Pulse 68   Ht 6\' 2"  (1.88 m)   Wt 198 lb 3.2 oz (89.9 kg)   SpO2 96%   BMI 25.45 kg/m   Scalp is normal except for focal area of complete hair loss behind R ear.  See picture. 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  Discussed possible injections or high dose topical steroids but given risk of hypopigmentation decided to observe but to contact me if any changes      , MD [facility]

## 2021-07-24 ENCOUNTER — Telehealth: Payer: 59 | Admitting: Nurse Practitioner

## 2021-07-24 DIAGNOSIS — L249 Irritant contact dermatitis, unspecified cause: Secondary | ICD-10-CM | POA: Diagnosis not present

## 2021-07-24 MED ORDER — TRIAMCINOLONE ACETONIDE 0.1 % EX CREA: 1.0000 "application " | TOPICAL_CREAM | Freq: Two times a day (BID) | CUTANEOUS | 0 refills | Status: DC

## 2021-07-24 NOTE — Progress Notes (Signed)
?Virtual Visit Consent  ? ?Ashby Dawes, you are scheduled for a virtual visit with a [facility] provider today.   ?  ?Just as with appointments in the office, your consent must be obtained to participate.  Your consent will be active for this visit and any virtual visit you may have with one of our providers in the next 365 days.   ?  ?If you have a MyChart account, a copy of this consent can be sent to you electronically.  All virtual visits are billed to your insurance company just like a traditional visit in the office.   ? ?As this is a virtual visit, video technology does not allow for your provider to perform a traditional examination.  This may limit your provider's ability to fully assess your condition.  If your provider identifies any concerns that need to be evaluated in person or the need to arrange testing (such as labs, EKG, etc.), we will make arrangements to do so.   ?  ?Although advances in technology are sophisticated, we cannot ensure that it will always work on either your end or our end.  If the connection with a video visit is poor, the visit may have to be switched to a telephone visit.  With either a video or telephone visit, we are not always able to ensure that we have a secure connection.    ? ?I need to obtain your verbal consent now.   Are you willing to proceed with your visit today?  ?  ?Joshua Marks has provided verbal consent on 07/24/2021 for a virtual visit (video or telephone). ?  ?Viviano Simas, FNP  ? ?Date: 07/24/2021 2:37 PM ? ? ?Virtual Visit via Video Note  ? ?IViviano Simas, connected with  Joshua Marks  ([id number], 01-16-1990) on 07/24/21 at  2:45 PM EST by a video-enabled telemedicine application and verified that I am speaking with the correct person using two identifiers. ? ?Location: ?Patient: Virtual Visit Location Patient: Home ?Provider: Virtual Visit Location Provider: Home Office ?  ?I discussed the limitations of evaluation and management by telemedicine and the  availability of in person appointments. The patient expressed understanding and agreed to proceed.   ? ?History of Present Illness: ?Joshua Marks is a 21 y.o. who identifies as a male who was assigned male at birth, and is being seen today for a rash that he started to notice on the back of his head and neck after using a new product called "hims" that is for hair growth.  ? ?He has been using the new product for the past 1-2 weeks.  ?The rash appeared last week and has continued to spread down his neck.  ? ?He has also noticed the rash on his fingers where he touched the product to apply it to scalp.  ?Problems:  ?Patient Active Problem List  ? Diagnosis Date Noted  ? Alopecia areata 03/28/2021  ? Unprotected sex 09/23/2020  ? Asthma 05/09/2020  ? Elevated serum creatinine 05/09/2020  ?  ?Allergies: No Known Allergies ?Medications:  ?Current Outpatient Medications:  ?  albuterol (VENTOLIN HFA) 108 (90 Base) MCG/ACT inhaler, Inhale 2 puffs into the lungs every 4 (four) hours as needed for wheezing or shortness of breath., Disp: 1 each, Rfl: 3 ? ?Observations/Objective: ?Patient is well-developed, well-nourished in no acute distress.  ?Resting comfortably  at home.  ?Head is normocephalic, atraumatic.  ?No labored breathing.  ?Speech is clear and coherent with logical content.  ?Patient is alert and oriented  at baseline.  ?Papular rash to posterior neck just below hairline and to distal portions of fingers bilaterally without involvement of the palms.  ? ?Assessment and Plan: ?1. Irritant contact dermatitis, unspecified trigger ?Stop using product (hims) ?May use topical cream for up to 2 weeks: ?- triamcinolone cream (KENALOG) 0.1 %; Apply 1 application. topically 2 (two) times daily.  Dispense: 30 g; Refill: 0 ?   ?Claritin in the day and benadryl before bed as needed until rash/itching resolve ? ?Avoid hot showers  ? ? ?Follow Up Instructions: ?I discussed the assessment and treatment plan with the patient. The  patient was provided an opportunity to ask questions and all were answered. The patient agreed with the plan and demonstrated an understanding of the instructions.  A copy of instructions were sent to the patient via MyChart unless otherwise noted below.  ? ? ?The patient was advised to call back or seek an in-person evaluation if the symptoms worsen or if the condition fails to improve as anticipated. ? ?Time:  ?I spent 10 minutes with the patient via telehealth technology discussing the above problems/concerns.   ? ?Viviano Simas, FNP  ?

## 2021-07-25 ENCOUNTER — Ambulatory Visit: Payer: 59 | Admitting: Family Medicine

## 2021-07-25 NOTE — Progress Notes (Deleted)
? ? ?  SUBJECTIVE:  ? ?CHIEF COMPLAINT / HPI:  ? ?Alopecia ? ? ?PERTINENT  PMH / PSH: Asthma ? ? ?OBJECTIVE:  ? ?There were no vitals taken for this visit.  ?*** ? ?ASSESSMENT/PLAN:  ? ?No problem-specific Assessment & Plan notes found for this encounter. ?  ? ? ?Carney Living, MD ?[facility]  ?

## 2021-10-08 ENCOUNTER — Emergency Department (HOSPITAL_BASED_OUTPATIENT_CLINIC_OR_DEPARTMENT_OTHER)
Admission: EM | Admit: 2021-10-08 | Discharge: 2021-10-08 | Disposition: A | Discharge status: Home / Self Care | Payer: 59 | Attending: Emergency Medicine | Admitting: Emergency Medicine

## 2021-10-08 ENCOUNTER — Encounter (HOSPITAL_BASED_OUTPATIENT_CLINIC_OR_DEPARTMENT_OTHER): Payer: Self-pay | Admitting: Emergency Medicine

## 2021-10-08 ENCOUNTER — Emergency Department (HOSPITAL_BASED_OUTPATIENT_CLINIC_OR_DEPARTMENT_OTHER): Payer: 59 | Admitting: Radiology

## 2021-10-08 ENCOUNTER — Other Ambulatory Visit: Payer: Self-pay

## 2021-10-08 DIAGNOSIS — J4521 Mild intermittent asthma with (acute) exacerbation: Secondary | ICD-10-CM | POA: Insufficient documentation

## 2021-10-08 DIAGNOSIS — Z7951 Long term (current) use of inhaled steroids: Secondary | ICD-10-CM | POA: Insufficient documentation

## 2021-10-08 DIAGNOSIS — R531 Weakness: Secondary | ICD-10-CM | POA: Insufficient documentation

## 2021-10-08 DIAGNOSIS — Z20822 Contact with and (suspected) exposure to covid-19: Secondary | ICD-10-CM | POA: Diagnosis not present

## 2021-10-08 DIAGNOSIS — R509 Fever, unspecified: Secondary | ICD-10-CM | POA: Diagnosis present

## 2021-10-08 HISTORY — DX: Unspecified asthma, uncomplicated: J45.909

## 2021-10-08 LAB — RESP PANEL BY RT-PCR (FLU A&B, COVID) ARPGX2
Influenza A by PCR: NEGATIVE
Influenza B by PCR: NEGATIVE
SARS Coronavirus 2 by RT PCR: NEGATIVE

## 2021-10-08 MED ORDER — PREDNISONE 10 MG PO TABS
60.0000 mg | ORAL_TABLET | Freq: Once | ORAL | Status: AC
  Administered 2021-10-08: 60 mg via ORAL
  Filled 2021-10-08: qty 1

## 2021-10-08 MED ORDER — IBUPROFEN 400 MG PO TABS
400.0000 mg | ORAL_TABLET | Freq: Once | ORAL | Status: AC | PRN
  Administered 2021-10-08: 400 mg via ORAL
  Filled 2021-10-08: qty 1

## 2021-10-08 MED ORDER — IPRATROPIUM-ALBUTEROL 0.5-2.5 (3) MG/3ML IN SOLN
3.0000 mL | Freq: Once | RESPIRATORY_TRACT | Status: AC
  Administered 2021-10-08: 3 mL via RESPIRATORY_TRACT
  Filled 2021-10-08: qty 3

## 2021-10-08 MED ORDER — ALBUTEROL SULFATE (2.5 MG/3ML) 0.083% IN NEBU
2.5000 mg | INHALATION_SOLUTION | Freq: Once | RESPIRATORY_TRACT | Status: AC
  Administered 2021-10-08: 2.5 mg via RESPIRATORY_TRACT
  Filled 2021-10-08: qty 3

## 2021-10-08 MED ORDER — PREDNISONE 20 MG PO TABS: ORAL_TABLET | ORAL | 0 refills | Status: DC

## 2021-10-08 NOTE — ED Notes (Signed)
Pt verbalizes understanding of discharge instructions. Opportunity for questioning and answers were provided. Pt discharged from ED to home.   ? ?

## 2021-10-08 NOTE — ED Triage Notes (Signed)
Pt started feeling unwell yesterday with generalized achiness, felt like he was overheating per patient, and upper resp congestion.

## 2021-10-08 NOTE — ED Provider Notes (Signed)
[facility] EMERGENCY DEPT Provider Note   CSN: [id number] Arrival date & time: 10/08/21  1615     History  Chief Complaint  Patient presents with   Fever    Joshua Marks is a 21 y.o. male.  HPI 21 year old male with a history of asthma presents with cough, shortness of breath, subjective fever and weakness.  Symptoms started yesterday.  Not sure how high his temperature was.  He has been coughing up some sputum.  He is also had some wheezing and uses albuterol inhaler which temporarily seems to help a little bit.  No vomiting.  Home Medications Prior to Admission medications   Medication Sig Start Date End Date Taking? Authorizing Provider  predniSONE (DELTASONE) 20 MG tablet 2 tabs po daily x 4 days 10/09/21  Yes Pricilla Loveless, MD  albuterol (VENTOLIN HFA) 108 (90 Base) MCG/ACT inhaler Inhale 2 puffs into the lungs every 4 (four) hours as needed for wheezing or shortness of breath. 09/25/20   Simmons-Robinson, Makiera, MD  triamcinolone cream (KENALOG) 0.1 % Apply 1 application. topically 2 (two) times daily. 07/24/21   Viviano Simas, FNP      Allergies    Patient has no known allergies.    Review of Systems   Review of Systems  Constitutional:  Positive for fever.  Respiratory:  Positive for cough, shortness of breath and wheezing.   Gastrointestinal:  Negative for vomiting.   Physical Exam Updated Vital Signs BP 124/66   Pulse 85   Temp 97.6 F (36.4 C)   Resp (!) 24   Ht 6\' 2"  (1.88 m)   Wt 86.2 kg   SpO2 92%   BMI 24.39 kg/m  Physical Exam Vitals and nursing note reviewed.  Constitutional:      General: He is not in acute distress.    Appearance: He is well-developed. He is not ill-appearing or diaphoretic.  HENT:     Head: Normocephalic and atraumatic.  Cardiovascular:     Rate and Rhythm: Normal rate and regular rhythm.     Heart sounds: Normal heart sounds.  Pulmonary:     Effort: Pulmonary effort is normal. No tachypnea, accessory muscle  usage or respiratory distress.     Breath sounds: Wheezing (diffuse, expiratory) present.  Abdominal:     Palpations: Abdomen is soft.     Tenderness: There is no abdominal tenderness.  Skin:    General: Skin is warm and dry.  Neurological:     Mental Status: He is alert.    ED Results / Procedures / Treatments   Labs (all labs ordered are listed, but only abnormal results are displayed) Labs Reviewed  RESP PANEL BY RT-PCR (FLU A&B, COVID) ARPGX2    EKG None  Radiology DG Chest [facility] 1 View  Result Date: 10/08/2021 CLINICAL DATA:  Cough and congestion. EXAM: PORTABLE CHEST 1 VIEW COMPARISON:  Chest x-ray 10/25/2019 FINDINGS: The heart size and mediastinal contours are within normal limits. Both lungs are clear. The visualized skeletal structures are unremarkable. IMPRESSION: No active disease. Electronically Signed   By: 12/25/2019 M.D.   On: 10/08/2021 17:36    Procedures Procedures    Medications Ordered in ED Medications  ibuprofen (ADVIL) tablet 400 mg (400 mg Oral Given 10/08/21 1655)  albuterol (PROVENTIL) (2.5 MG/3ML) 0.083% nebulizer solution 2.5 mg (2.5 mg Nebulization Given 10/08/21 1815)  ipratropium-albuterol (DUONEB) 0.5-2.5 (3) MG/3ML nebulizer solution 3 mL (3 mLs Nebulization Given 10/08/21 1815)  predniSONE (DELTASONE) tablet 60 mg (60 mg Oral Given  10/08/21 1819)    ED Course/ Medical Decision Making/ A&P                           Medical Decision Making Amount and/or Complexity of Data Reviewed Independent Historian: parent External Data Reviewed: notes. Radiology: ordered and independent interpretation performed.  Risk Prescription drug management.   Patient presents with an asthma exacerbation.  This is likely from a viral illness.  He was given DuoNeb along with prednisone and he is feeling significantly better.  Wheezing has essentially resolved on repeat exam.  COVID/flu testing is negative.  I personally viewed his chest x-ray images and there  is no pneumonia or pneumothorax.  I doubt there is a bacterial cause.  He was given a refill of his albuterol inhaler here along with a spacer and feels comfortable with discharge.  Will discharge home with a prednisone burst.        Final Clinical Impression(s) / ED Diagnoses Final diagnoses:  Mild intermittent asthma with exacerbation    Rx / DC Orders ED Discharge Orders          Ordered    predniSONE (DELTASONE) 20 MG tablet        10/08/21 1909              Pricilla Loveless, MD 10/08/21 1928

## 2021-10-08 NOTE — Discharge Instructions (Addendum)
Use the albuterol inhaler 2 puffs every 4 hours for cough, shortness of breath, or wheezing.  If you develop new or worsening shortness of breath or needed significantly more than that then return to the ER or call 911.  Start the prednisone tomorrow as you were given the first dose here.

## 2021-10-08 NOTE — ED Triage Notes (Signed)
Short of breath some last night, used his inhaler that he has for prn but no change.

## 2021-10-24 ENCOUNTER — Encounter: Payer: Self-pay | Admitting: *Deleted

## 2021-12-07 IMAGING — DX DG CHEST 2V
2 series · 2 of 2 positions shown · non-contrast
Comparison: 08/11/2019

CLINICAL DATA: Chest pain, shortness of breath

EXAM:
CHEST - 2 VIEW

[chest pa]
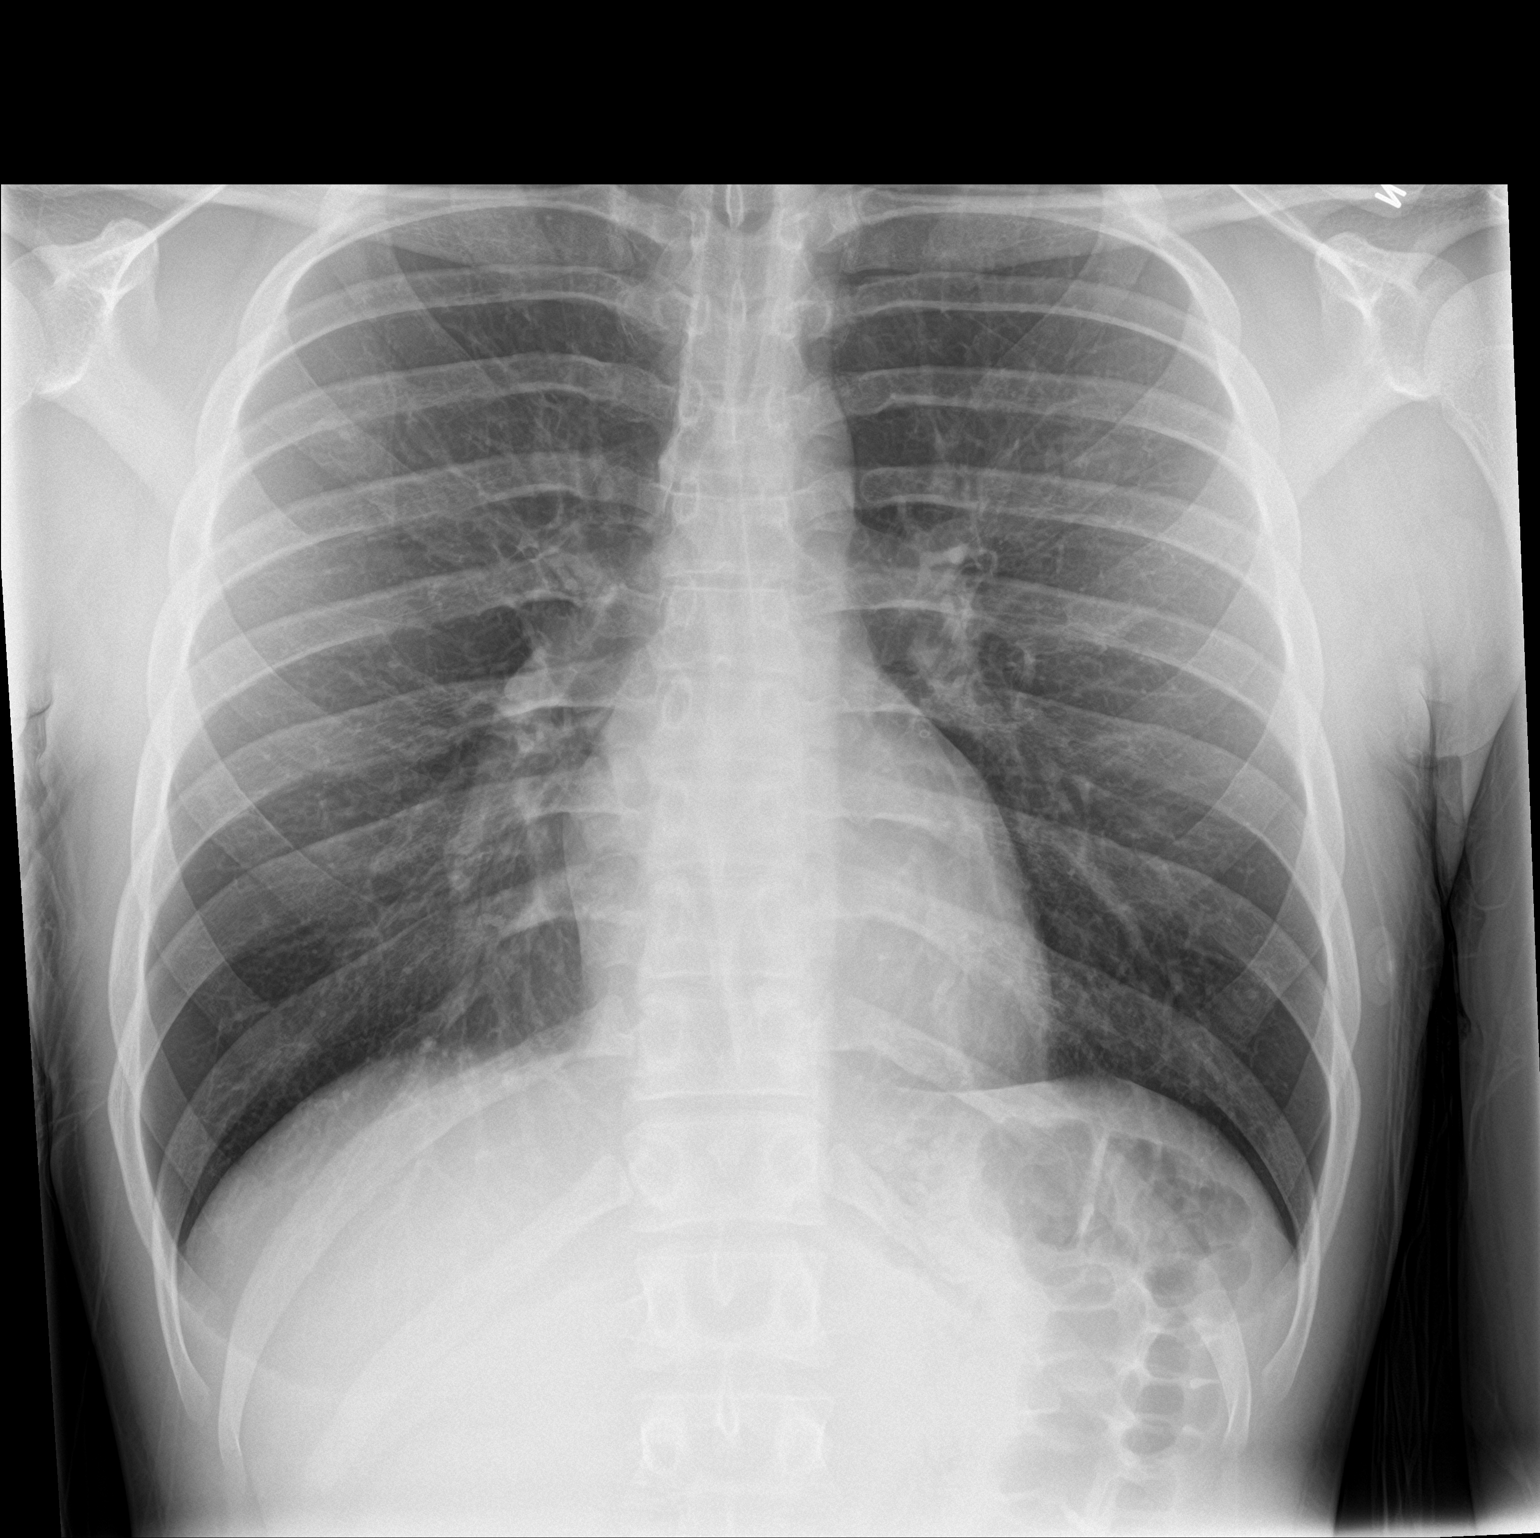

[chest lat]
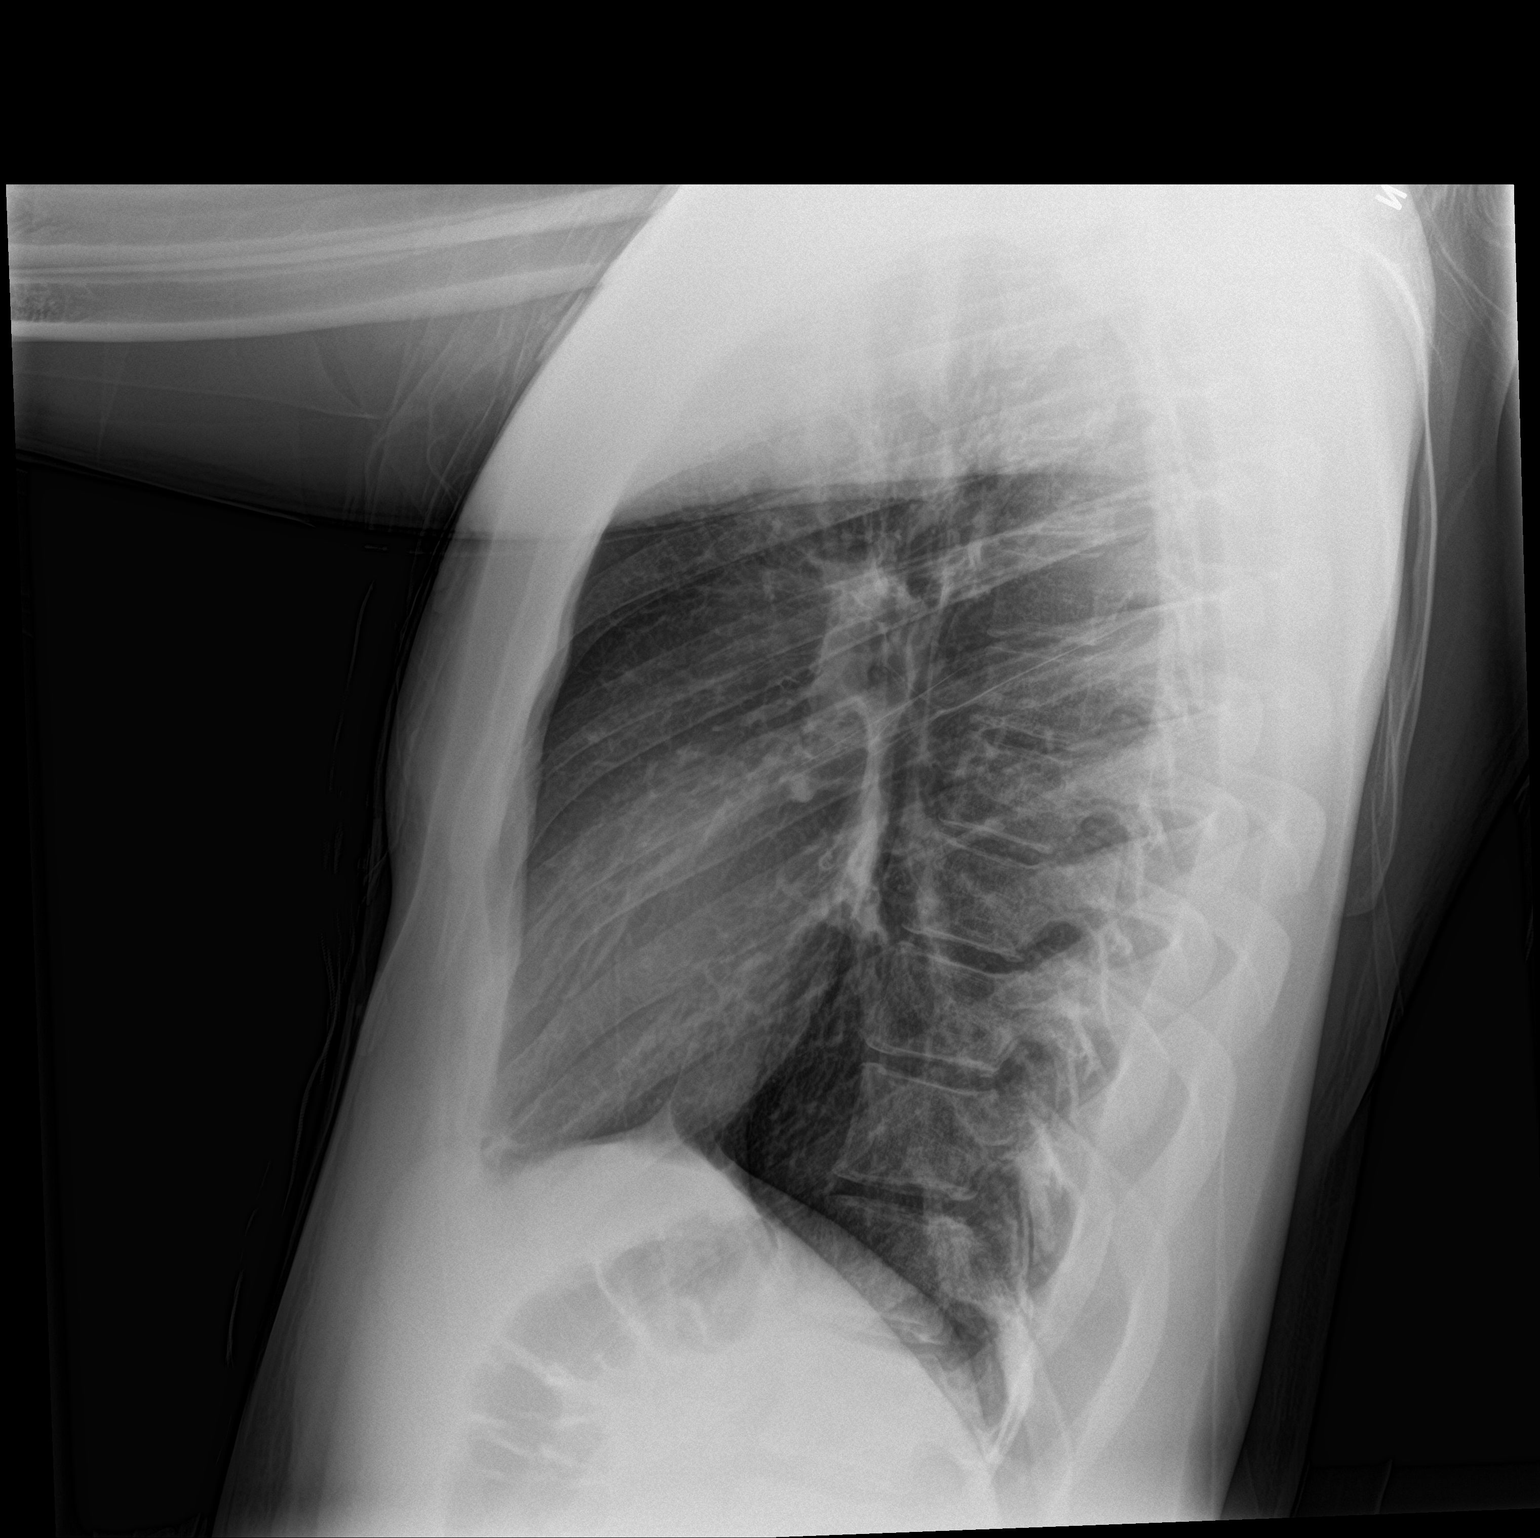

[2 of 2 positions shown; findings below may reference images not displayed]

FINDINGS: Normal heart size, mediastinal contours, and pulmonary vascularity.

Lungs clear.

No pleural effusion or pneumothorax.

Bones unremarkable.
IMPRESSION: Normal exam.

## 2022-01-22 ENCOUNTER — Emergency Department (HOSPITAL_COMMUNITY)
Admission: EM | Admit: 2022-01-22 | Discharge: 2022-01-22 | Disposition: A | Discharge status: Home / Self Care | Payer: 59 | Attending: Emergency Medicine | Admitting: Emergency Medicine

## 2022-01-22 ENCOUNTER — Emergency Department (HOSPITAL_COMMUNITY): Payer: 59

## 2022-01-22 ENCOUNTER — Other Ambulatory Visit: Payer: Self-pay

## 2022-01-22 ENCOUNTER — Encounter (HOSPITAL_COMMUNITY): Payer: Self-pay

## 2022-01-22 DIAGNOSIS — J45901 Unspecified asthma with (acute) exacerbation: Secondary | ICD-10-CM | POA: Insufficient documentation

## 2022-01-22 DIAGNOSIS — R0602 Shortness of breath: Secondary | ICD-10-CM | POA: Diagnosis present

## 2022-01-22 MED ORDER — PREDNISONE 20 MG PO TABS
60.0000 mg | ORAL_TABLET | Freq: Once | ORAL | Status: AC
Start: 2022-01-22 — End: 2022-01-22
  Administered 2022-01-22: 60 mg via ORAL
  Filled 2022-01-22: qty 3

## 2022-01-22 MED ORDER — PREDNISONE 20 MG PO TABS: ORAL_TABLET | ORAL | 0 refills | Status: DC

## 2022-01-22 MED ORDER — ALBUTEROL SULFATE HFA 108 (90 BASE) MCG/ACT IN AERS
1.0000 | INHALATION_SPRAY | RESPIRATORY_TRACT | Status: DC | PRN
  Filled 2022-01-22: qty 6.7

## 2022-01-22 MED ORDER — ALBUTEROL SULFATE (2.5 MG/3ML) 0.083% IN NEBU
5.0000 mg | INHALATION_SOLUTION | Freq: Once | RESPIRATORY_TRACT | Status: AC
  Administered 2022-01-22: 5 mg via RESPIRATORY_TRACT
  Filled 2022-01-22: qty 6

## 2022-01-22 MED ORDER — IPRATROPIUM-ALBUTEROL 0.5-2.5 (3) MG/3ML IN SOLN
3.0000 mL | Freq: Once | RESPIRATORY_TRACT | Status: AC
  Administered 2022-01-22: 3 mL via RESPIRATORY_TRACT
  Filled 2022-01-22: qty 3

## 2022-01-22 NOTE — ED Triage Notes (Signed)
Patient reports trouble breathing that began this morning. Patient reports cough with some mucous. Patient has a history of asthma and used albuterol throughout the day with no relief.

## 2022-01-22 NOTE — ED Provider Notes (Signed)
[facility]-EMERGENCY DEPT Provider Note   CSN: [id number] Arrival date & time: 01/22/22  2048     History  Chief Complaint  Patient presents with   Shortness of Breath    Joshua Marks is a 21 y.o. male.  Patient is a 21 year old male who has a history of asthma who presents with wheezing and shortness of breath.  He has had a cough since yesterday.  He said he is coughing up some yellow mucus.  Today he started having some increased asthma symptoms.  He has been using his albuterol inhaler throughout the day with no improvement in symptoms.  He does not have a nebulizer machine.  He denies any fevers.  No leg swelling.       Home Medications Prior to Admission medications   Medication Sig Start Date End Date Taking? Authorizing Provider  predniSONE (DELTASONE) 20 MG tablet 2 tabs po daily x 4 days 01/22/22  Yes Rolan Bucco, MD  albuterol (VENTOLIN HFA) 108 (90 Base) MCG/ACT inhaler Inhale 2 puffs into the lungs every 4 (four) hours as needed for wheezing or shortness of breath. 09/25/20   Simmons-Robinson, Makiera, MD  triamcinolone cream (KENALOG) 0.1 % Apply 1 application. topically 2 (two) times daily. 07/24/21   Viviano Simas, FNP      Allergies    Patient has no known allergies.    Review of Systems   Review of Systems  Constitutional:  Positive for fatigue. Negative for chills, diaphoresis and fever.  HENT:  Negative for congestion, rhinorrhea and sneezing.   Eyes: Negative.   Respiratory:  Positive for cough, shortness of breath and wheezing. Negative for chest tightness.   Cardiovascular:  Negative for chest pain and leg swelling.  Gastrointestinal:  Negative for abdominal pain, blood in stool, diarrhea, nausea and vomiting.  Genitourinary:  Negative for difficulty urinating, flank pain, frequency and hematuria.  Musculoskeletal:  Negative for arthralgias and back pain.  Skin:  Negative for rash.  Neurological:  Negative for dizziness, speech  difficulty, weakness, numbness and headaches.    Physical Exam Updated Vital Signs BP (!) 148/87   Pulse 91   Temp 98.5 F (36.9 C) (Oral)   Resp (!) 23   Ht 6\' 2"  (1.88 m)   Wt 93 kg   SpO2 98%   BMI 26.32 kg/m  Physical Exam Constitutional:      Appearance: He is well-developed.  HENT:     Head: Normocephalic and atraumatic.  Eyes:     Pupils: Pupils are equal, round, and reactive to light.  Cardiovascular:     Rate and Rhythm: Normal rate and regular rhythm.     Heart sounds: Normal heart sounds.  Pulmonary:     Effort: Tachypnea and accessory muscle usage present. No respiratory distress.     Breath sounds: Decreased breath sounds and wheezing present. No rales.  Chest:     Chest wall: No tenderness.  Abdominal:     General: Bowel sounds are normal.     Palpations: Abdomen is soft.     Tenderness: There is no abdominal tenderness. There is no guarding or rebound.  Musculoskeletal:        General: Normal range of motion.     Cervical back: Normal range of motion and neck supple.     Comments: No edema or calf tenderness  Lymphadenopathy:     Cervical: No cervical adenopathy.  Skin:    General: Skin is warm and dry.     Findings: No  rash.  Neurological:     Mental Status: He is alert and oriented to person, place, and time.     ED Results / Procedures / Treatments   Labs (all labs ordered are listed, but only abnormal results are displayed) Labs Reviewed - No data to display  EKG EKG Interpretation  Date/Time:  Monday January 22 2022 21:44:58 EDT Ventricular Rate:  92 PR Interval:  118 QRS Duration: 109 QT Interval:  326 QTC Calculation: 404 R Axis:   85 Text Interpretation: Sinus rhythm Borderline short PR interval RSR' in V1 or V2, right VCD or RVH Confirmed by Rolan Bucco [phone]) on 01/22/2022 10:21:09 PM  Radiology DG Chest Port 1 View  Result Date: 01/22/2022 CLINICAL DATA:  Shortness of breath and cough. EXAM: PORTABLE CHEST 1 VIEW  COMPARISON:  Chest radiograph dated 10/08/2021. FINDINGS: The heart size and mediastinal contours are within normal limits. Both lungs are clear. The visualized skeletal structures are unremarkable. IMPRESSION: No active disease. Electronically Signed   By: Elgie Collard M.D.   On: 01/22/2022 21:50    Procedures Procedures    Medications Ordered in ED Medications  albuterol (VENTOLIN HFA) 108 (90 Base) MCG/ACT inhaler 1-2 puff (has no administration in time range)  ipratropium-albuterol (DUONEB) 0.5-2.5 (3) MG/3ML nebulizer solution 3 mL (3 mLs Nebulization Given 01/22/22 2135)  predniSONE (DELTASONE) tablet 60 mg (60 mg Oral Given 01/22/22 2135)  albuterol (PROVENTIL) (2.5 MG/3ML) 0.083% nebulizer solution 5 mg (5 mg Nebulization Given 01/22/22 2214)    ED Course/ Medical Decision Making/ A&P                           Medical Decision Making Amount and/or Complexity of Data Reviewed Radiology: ordered.  Risk Prescription drug management.   Patient is a 21 year old who presents with an asthma exacerbation.  He did have some sputum.  Chest x-ray was performed which shows no acute abnormality.  No evidence of pneumonia.  This was interpreted by me and confirmed by the radiologist.  He was given nebulizer treatments and a dose of prednisone.  He is feeling better after this.  He still has some wheezing but no increased work of breathing.  No hypoxia.  He says that he is ready to go.  He was discharged home in good condition.  Was advised to follow-up with his PCP.  He was started on prednisone burst.  Was discharged with an extra inhaler.  Final Clinical Impression(s) / ED Diagnoses Final diagnoses:  Moderate asthma with exacerbation, unspecified whether persistent    Rx / DC Orders ED Discharge Orders          Ordered    predniSONE (DELTASONE) 20 MG tablet        01/22/22 2303              Rolan Bucco, MD 01/22/22 2305

## 2022-06-11 ENCOUNTER — Encounter (HOSPITAL_BASED_OUTPATIENT_CLINIC_OR_DEPARTMENT_OTHER): Payer: Self-pay | Admitting: Emergency Medicine

## 2022-06-11 ENCOUNTER — Observation Stay (HOSPITAL_BASED_OUTPATIENT_CLINIC_OR_DEPARTMENT_OTHER)
Admission: EM | Admit: 2022-06-11 | Discharge: 2022-06-12 | Disposition: A | Discharge status: Home / Self Care | Payer: 59 | Attending: Family Medicine | Admitting: Family Medicine

## 2022-06-11 ENCOUNTER — Emergency Department (HOSPITAL_BASED_OUTPATIENT_CLINIC_OR_DEPARTMENT_OTHER): Payer: 59 | Admitting: Radiology

## 2022-06-11 ENCOUNTER — Other Ambulatory Visit: Payer: Self-pay

## 2022-06-11 ENCOUNTER — Encounter (HOSPITAL_COMMUNITY): Payer: Self-pay

## 2022-06-11 DIAGNOSIS — Z1152 Encounter for screening for COVID-19: Secondary | ICD-10-CM | POA: Diagnosis not present

## 2022-06-11 DIAGNOSIS — J4541 Moderate persistent asthma with (acute) exacerbation: Secondary | ICD-10-CM

## 2022-06-11 DIAGNOSIS — J45901 Unspecified asthma with (acute) exacerbation: Secondary | ICD-10-CM | POA: Diagnosis not present

## 2022-06-11 DIAGNOSIS — R0602 Shortness of breath: Secondary | ICD-10-CM | POA: Diagnosis present

## 2022-06-11 LAB — CBC WITH DIFFERENTIAL/PLATELET
Abs Immature Granulocytes: 0.06 10*3/uL (ref 0.00–0.07)
Basophils Absolute: 0 10*3/uL (ref 0.0–0.1)
Basophils Relative: 0 %
Eosinophils Absolute: 0.6 10*3/uL — ABNORMAL HIGH (ref 0.0–0.5)
Eosinophils Relative: 5 %
HCT: 49 % (ref 39.0–52.0)
Hemoglobin: 16.6 g/dL (ref 13.0–17.0)
Immature Granulocytes: 1 %
Lymphocytes Relative: 7 %
Lymphs Abs: 0.9 10*3/uL (ref 0.7–4.0)
MCH: 32.6 pg (ref 26.0–34.0)
MCHC: 33.9 g/dL (ref 30.0–36.0)
MCV: 96.3 fL (ref 80.0–100.0)
Monocytes Absolute: 1 10*3/uL (ref 0.1–1.0)
Monocytes Relative: 8 %
Neutro Abs: 10 10*3/uL — ABNORMAL HIGH (ref 1.7–7.7)
Neutrophils Relative %: 79 %
Platelets: 233 10*3/uL (ref 150–400)
RBC: 5.09 MIL/uL (ref 4.22–5.81)
RDW: 12.9 % (ref 11.5–15.5)
WBC: 12.6 10*3/uL — ABNORMAL HIGH (ref 4.0–10.5)
nRBC: 0 % (ref 0.0–0.2)

## 2022-06-11 LAB — BASIC METABOLIC PANEL
Anion gap: 11 (ref 5–15)
BUN: 10 mg/dL (ref 6–20)
CO2: 25 mmol/L (ref 22–32)
Calcium: 9.7 mg/dL (ref 8.9–10.3)
Chloride: 103 mmol/L (ref 98–111)
Creatinine, Ser: 1.16 mg/dL (ref 0.61–1.24)
GFR, Estimated: >60 mL/min (ref >60)
Glucose, Bld: 97 mg/dL (ref 70–99)
Potassium: 3.9 mmol/L (ref 3.5–5.1)
Sodium: 139 mmol/L (ref 135–145)

## 2022-06-11 LAB — RESP PANEL BY RT-PCR (RSV, FLU A&B, COVID)  RVPGX2
Influenza A by PCR: NEGATIVE
Influenza B by PCR: NEGATIVE
Resp Syncytial Virus by PCR: NEGATIVE
SARS Coronavirus 2 by RT PCR: NEGATIVE

## 2022-06-11 MED ORDER — ALBUTEROL SULFATE (2.5 MG/3ML) 0.083% IN NEBU
5.0000 mg | INHALATION_SOLUTION | Freq: Once | RESPIRATORY_TRACT | Status: AC
  Administered 2022-06-11: 5 mg via RESPIRATORY_TRACT

## 2022-06-11 MED ORDER — METHYLPREDNISOLONE SODIUM SUCC 125 MG IJ SOLR
125.0000 mg | Freq: Once | INTRAMUSCULAR | Status: AC
  Administered 2022-06-11: 125 mg via INTRAVENOUS
  Filled 2022-06-11: qty 2

## 2022-06-11 MED ORDER — ALBUTEROL SULFATE (2.5 MG/3ML) 0.083% IN NEBU
INHALATION_SOLUTION | RESPIRATORY_TRACT | Status: AC
  Filled 2022-06-11: qty 6

## 2022-06-11 MED ORDER — ENOXAPARIN SODIUM 40 MG/0.4ML IJ SOSY
40.0000 mg | PREFILLED_SYRINGE | INTRAMUSCULAR | Status: DC
  Filled 2022-06-11: qty 0.4

## 2022-06-11 MED ORDER — IPRATROPIUM BROMIDE 0.02 % IN SOLN
0.5000 mg | Freq: Once | RESPIRATORY_TRACT | Status: AC
Start: 2022-06-11 — End: 2022-06-11
  Administered 2022-06-11: 0.5 mg via RESPIRATORY_TRACT
  Filled 2022-06-11: qty 2.5

## 2022-06-11 MED ORDER — ALBUTEROL SULFATE HFA 108 (90 BASE) MCG/ACT IN AERS
2.0000 | INHALATION_SPRAY | RESPIRATORY_TRACT | Status: DC | PRN
  Filled 2022-06-11: qty 6.7

## 2022-06-11 MED ORDER — ALBUTEROL SULFATE (2.5 MG/3ML) 0.083% IN NEBU
5.0000 mg | INHALATION_SOLUTION | Freq: Once | RESPIRATORY_TRACT | Status: AC
  Administered 2022-06-11: 5 mg via RESPIRATORY_TRACT
  Filled 2022-06-11: qty 6

## 2022-06-11 MED ORDER — MAGNESIUM SULFATE 2 GM/50ML IV SOLN
2.0000 g | Freq: Once | INTRAVENOUS | Status: AC
Start: 2022-06-11 — End: 2022-06-11
  Administered 2022-06-11: 2 g via INTRAVENOUS
  Filled 2022-06-11: qty 50

## 2022-06-11 MED ORDER — ALBUTEROL SULFATE (2.5 MG/3ML) 0.083% IN NEBU
10.0000 mg | INHALATION_SOLUTION | Freq: Once | RESPIRATORY_TRACT | Status: AC
  Administered 2022-06-11: 10 mg via RESPIRATORY_TRACT
  Filled 2022-06-11: qty 12

## 2022-06-11 MED ORDER — IPRATROPIUM-ALBUTEROL 0.5-2.5 (3) MG/3ML IN SOLN
3.0000 mL | RESPIRATORY_TRACT | Status: DC
  Administered 2022-06-11 – 2022-06-12 (×4): 3 mL via RESPIRATORY_TRACT
  Filled 2022-06-11 (×4): qty 3

## 2022-06-11 MED ORDER — PREDNISONE 20 MG PO TABS
40.0000 mg | ORAL_TABLET | Freq: Every day | ORAL | Status: DC
  Administered 2022-06-12: 40 mg via ORAL
  Filled 2022-06-11: qty 2

## 2022-06-11 NOTE — Progress Notes (Signed)
FMTS Interim Progress Note  Received call from Joanette Gula, [place] at [facility] via [place] for transfer to [facility] for asthma exacerbation. Family Medicine Teaching Service will admit this patient. Please page [phone] upon arrival.  August Albino, MD 06/11/2022, 5:09 PM PGY-1, [place] Medicine Service pager [phone]

## 2022-06-11 NOTE — ED Provider Notes (Signed)
[facility] Provider Note   CSN: [id number] Arrival date & time: 06/11/22  1202     History  Chief Complaint  Patient presents with   Shortness of Breath   HPI Joshua Marks is a 22 y.o. male with asthma presenting for shortness of breath.  Started yesterday.  Patient was lying down and began short of breath but noticed it was worse with exertion.  Also endorses chest pain  that only occurs with coughing and is nonradiating.  Patient used his inhaler several times yesterday which improved his symptoms but shortness of breath returned ultimately prompted his evaluation today.  Endorses productive cough but no fever.  Denies calf tenderness, long trips, recent weight gain and lower extremity edema.   Shortness of Breath      Home Medications Prior to Admission medications   Not on File      Allergies    Patient has no known allergies.    Review of Systems   Review of Systems  Respiratory:  Positive for shortness of breath.     Physical Exam   Vitals:   06/12/22 0633 06/12/22 0733  BP: (!) 132/90   Pulse: 84 90  Resp:  17  Temp: 98.2 F (36.8 C)   SpO2: 91% 92%    CONSTITUTIONAL:  well-appearing, NAD NEURO:  Alert and oriented x 3, CN 3-12 grossly intact EYES:  eyes equal and reactive ENT/NECK:  Supple, no stridor  CARDIO:  regular rate and rhythm, appears well-perfused PULM:  No respiratory distress, Diffuse expiratory wheezing bilaterally MSK/SPINE:  No gross deformities, no edema, moves all extremities  SKIN:  no rash, atraumatic   *Additional and/or pertinent findings included in MDM below    ED Results / Procedures / Treatments   Labs (all labs ordered are listed, but only abnormal results are displayed) Labs Reviewed  CBC WITH DIFFERENTIAL/PLATELET - Abnormal; Notable for the following components:      Result Value   WBC 12.6 (*)    Neutro Abs 10.0 (*)    Eosinophils Absolute 0.6 (*)    All other  components within normal limits  BASIC METABOLIC PANEL - Abnormal; Notable for the following components:   Glucose, Bld 117 (*)    All other components within normal limits  RESP PANEL BY RT-PCR (RSV, FLU A&B, COVID)  RVPGX2  BASIC METABOLIC PANEL  CBC  HIV ANTIBODY (ROUTINE TESTING W REFLEX)    EKG EKG Interpretation  Date/Time:  Monday June 11 2022 12:51:53 EST Ventricular Rate:  74 PR Interval:  120 QRS Duration: 117 QT Interval:  375 QTC Calculation: 416 R Axis:   89 Text Interpretation: Sinus rhythm Incomplete right bundle branch block No significant change was found Confirmed by Ezequiel Essex [phone]) on 06/11/2022 2:16:12 PM  Radiology DG Chest 2 View  Result Date: 06/11/2022 CLINICAL DATA:  Dyspnea with exertion. EXAM: CHEST - 2 VIEW COMPARISON:  January 22, 2022. FINDINGS: The heart size and mediastinal contours are within normal limits. Both lungs are clear. The visualized skeletal structures are unremarkable. IMPRESSION: No active cardiopulmonary disease. Electronically Signed   By: Marijo Conception M.D.   On: 06/11/2022 12:45    Procedures Procedures    Medications Ordered in ED Medications  albuterol (VENTOLIN HFA) 108 (90 Base) MCG/ACT inhaler 2 puff (has no administration in time range)  albuterol (PROVENTIL) (2.5 MG/3ML) 0.083% nebulizer solution (  Not Given 06/11/22 1237)  ipratropium-albuterol (DUONEB) 0.5-2.5 (3) MG/3ML nebulizer solution 3 mL (  3 mLs Nebulization Given 06/12/22 0733)  enoxaparin (LOVENOX) injection 40 mg (has no administration in time range)  predniSONE (DELTASONE) tablet 40 mg (has no administration in time range)  albuterol (PROVENTIL) (2.5 MG/3ML) 0.083% nebulizer solution 5 mg (5 mg Nebulization Given 06/11/22 1232)  ipratropium (ATROVENT) nebulizer solution 0.5 mg (0.5 mg Nebulization Given 06/11/22 1237)  methylPREDNISolone sodium succinate (SOLU-MEDROL) 125 mg/2 mL injection 125 mg (125 mg Intravenous Given 06/11/22 1257)  albuterol  (PROVENTIL) (2.5 MG/3ML) 0.083% nebulizer solution 5 mg (5 mg Nebulization Given 06/11/22 1303)  albuterol (PROVENTIL) (2.5 MG/3ML) 0.083% nebulizer solution 10 mg (10 mg Nebulization Given 06/11/22 1504)  magnesium sulfate IVPB 2 g 50 mL (0 g Intravenous Stopped 06/11/22 1652)    ED Course/ Medical Decision Making/ A&P                             Medical Decision Making Amount and/or Complexity of Data Reviewed Labs: ordered. Radiology: ordered.  Risk Prescription drug management. Decision regarding hospitalization.   Initial Impression and Ddx 22 year old well-appearing male who is hemodynamically stable presenting for shortness of breath.  Physical exam notable for diffuse expiratory wheezing in all lung fields.  Differential diagnosis for this complaint includes asthma exacerbation, pneumonia, URI, PE and CHF.  Patient PMH that increases complexity of ED encounter: Asthma  Interpretation of Diagnostics I independent reviewed and interpreted the labs as followed: Leukocytosis  - I independently visualized the following imaging with scope of interpretation limited to determining acute life threatening conditions related to emergency care: Chest x-ray, which revealed no acute cardiopulmonary process  I independently interpreted and reviewed EKG which revealed sinus rhythm  Patient Reassessment and Ultimate Disposition/Management Initially concern for asthma exacerbation.  Treated with continuous albuterol, ipratropium and Solu-Medrol.  Upon reevaluation, patient was sitting upright with O2 sats in the mid 80s on room air.  Lung sounds continue to sound coarse with diffuse wheezing.  Patient stated that he did feel better but given his new hypoxia and concern for suboptimal air entry, this ultimately prompted placement on O2 via nasal cannula and admission to the hospital for asthma exacerbation.  Excepted to family practice medicine service.  Dr. Vikki Ports will serve as his attending  physician.   Patient management required discussion with the following services or consulting groups:  Hospitalist Service  Complexity of Problems Addressed Acute complicated illness or Injury  Additional Data Reviewed and Analyzed Further history obtained from: Past medical history and medications listed in the EMR and Recent PCP notes  Patient Encounter Risk Assessment Consideration of hospitalization         Final Clinical Impression(s) / ED Diagnoses Final diagnoses:  Exacerbation of asthma, unspecified asthma severity, unspecified whether persistent    Rx / DC Orders ED Discharge Orders     None         Harriet Pho, PA-C 06/12/22 1950    Ezequiel Essex, MD 06/12/22 1203

## 2022-06-11 NOTE — ED Notes (Signed)
Report called to [facility]. Pt awaiting [facility].

## 2022-06-11 NOTE — ED Triage Notes (Addendum)
Pt reports SHOB and cough that started yesterday. Pt reports using an inhaler with relief. Pt denies pain. Denies fever.

## 2022-06-11 NOTE — Assessment & Plan Note (Addendum)
Admitted for new oxygen requirement, on 2L with baseline being no oxygen supplementation. CXR notable for no acute changes. Will admit for monitoring and to wean off oxygen. -Admit to FPTS, attending Dr. Andria Frames -continue duonebs -continue albuterol -s/p Mg -s/p solumedrol, start prednisone in the am -monitor respiratory status -wean O2 as tolerated -continuous pulse ox -vitals per routine -consider maintenance inhaler upon discharge if symptoms persist

## 2022-06-11 NOTE — H&P (Addendum)
Hospital Admission History and Physical Service Pager: [phone]  Patient name: Joshua Marks Medical record number: [id number] Date of Birth: [birth year] Age: 22 y.o. Gender: male  Primary Care Provider: Arlyce Dice, MD Consultants: none Code Status: Full code  Preferred Emergency Contact: Naveen Clardy (mother) [phone]  Chief Complaint: dyspnea  Assessment and Plan: Joshua Marks is a 22 y.o. male presenting with dyspnea. Differential for this patient's presentation of this includes viral illness (quad panel negative for RSV/COVID and Flu and patient reports being afebrile with no sick contacts); allergy exacerbation (patient does not take a regular seasonal allergy pill); progression of asthma severity (possibly needs maintenance inhaler in conjunction with albuterol as needed). Low concern for PE given Wells score of 0 and not having correlating symptoms. Low concern for other infectious etiology given lack of focal findings on both imaging and respiratory exam.   * Asthma exacerbation Admitted for new oxygen requirement, on 2L with baseline being no oxygen supplementation. CXR notable for no acute changes. Will admit for monitoring and to wean off oxygen. -Admit to FPTS, attending Dr. Andria Frames -continue duonebs -continue albuterol -s/p Mg -s/p solumedrol, start prednisone in the am -monitor respiratory status -wean O2 as tolerated -continuous pulse ox -vitals per routine -consider maintenance inhaler upon discharge if symptoms persist    FEN/GI: regular diet  VTE Prophylaxis: lovenox   Disposition: admit to cardiac telemetry, attending Dr. Andria Frames   History of Present Illness:  Joshua Marks is a 22 y.o. male presenting with shortness of breath that started yesterday. Reports worsening dyspnea with exertion. Denies chest pain. Since these symptoms have started, he has used his inhaler many times which has helped his dyspnea. Denies fever, chills, leg swelling or tenderness,  nausea, vomiting or increased sputum production.   Exacerbation started yesterday with a cough. He does not regularly use his inhaler but has had to use it when he gets sick or has symptoms like he has now. He was having difficulty sleeping last night due to his cough and congestion.   Breathing treatments have helped him thus far. Reports feeling better now. Denies prior hospitalizations. He usually experiences asthma attacks 1-2 times a year. 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   Initially presented to the [facility] ED where has was placed on a nebulizer and given duonebs. Also given a dose of Mg and solumedrol. CXR demonstrated no acute cardiopulmonary disease. Patient transferred to [facility] for direct admission for monitoring and weaning off oxygen given new oxygen requirement.    Review Of Systems: Per HPI with the following additions: as above   Pertinent Past Medical History: Asthma   Pertinent Past Surgical History: None  Remainder reviewed in history tab.  Pertinent Social History: Tobacco use: No Alcohol use: occasionally  Other Substance use: occasional marijuana use  Lives with girlfriend   Pertinent Family History: Father and aunt have asthma.   Remainder reviewed in history tab.   Important Outpatient Medications: None Remainder reviewed in medication history.   Objective: BP 135/71 (BP Location: Right Arm)   Pulse 83   Temp 99.4 F (37.4 C) (Oral)   Resp 17   Ht 6\' 2"  (1.88 m)   Wt 93 kg   SpO2 94%   BMI 26.32 kg/m  Exam: Well-appearing, no acute distress Cardio: Regular rate, regular rhythm, no murmurs on exam. Pulm: Coarse breath sounds, good air movement, mild expiratory wheezing with prolonged expiratory phase. No focal consolidations or crackles appreciated.  No increased work of breathing.  Abdominal: bowel sounds present, soft, non-tender, non-distended Extremities: no peripheral edema  Neuro: alert and oriented x3, speech  normal in content, no facial asymmetry, strength intact and equal bilaterally in UE and LE, pupils equal and reactive to light.    Labs:  CBC BMET  Recent Labs  Lab 06/11/22 1250  WBC 12.6*  HGB 16.6  HCT 49.0  PLT 233   Recent Labs  Lab 06/11/22 1250  NA 139  K 3.9  CL 103  CO2 25  BUN 10  CREATININE 1.16  GLUCOSE 97  CALCIUM 9.7     Imaging Studies Performed:  CXR: negative for active cardiopulmonary process.    Darci Current, DO 06/11/2022, 11:05 PM PGY-1, [place] Intern pager: [phone], text pages welcome Secure chat group [place]   I was personally present and performed or re-performed the history, physical exam and medical decision making activities of this service and have verified that the service and findings are accurately documented in the intern's note. My edits are noted within the note above. Please also see attending's attestation.   Donney Dice, DO                  06/11/2022, 11:27 PM  PGY-3, [place]

## 2022-06-11 NOTE — ED Notes (Signed)
Pt to [facility] via [place]. Pt has belongings.

## 2022-06-12 ENCOUNTER — Other Ambulatory Visit (HOSPITAL_COMMUNITY): Payer: Self-pay

## 2022-06-12 LAB — BASIC METABOLIC PANEL
Anion gap: 12 (ref 5–15)
BUN: 10 mg/dL (ref 6–20)
CO2: 23 mmol/L (ref 22–32)
Calcium: 9.5 mg/dL (ref 8.9–10.3)
Chloride: 103 mmol/L (ref 98–111)
Creatinine, Ser: 1.13 mg/dL (ref 0.61–1.24)
GFR, Estimated: >60 mL/min (ref >60)
Glucose, Bld: 117 mg/dL — ABNORMAL HIGH (ref 70–99)
Potassium: 4.1 mmol/L (ref 3.5–5.1)
Sodium: 138 mmol/L (ref 135–145)

## 2022-06-12 LAB — CBC
HCT: 47.8 % (ref 39.0–52.0)
Hemoglobin: 16.3 g/dL (ref 13.0–17.0)
MCH: 32.8 pg (ref 26.0–34.0)
MCHC: 34.1 g/dL (ref 30.0–36.0)
MCV: 96.2 fL (ref 80.0–100.0)
Platelets: 251 10*3/uL (ref 150–400)
RBC: 4.97 MIL/uL (ref 4.22–5.81)
RDW: 13 % (ref 11.5–15.5)
WBC: 10.3 10*3/uL (ref 4.0–10.5)
nRBC: 0 % (ref 0.0–0.2)

## 2022-06-12 LAB — HIV ANTIBODY (ROUTINE TESTING W REFLEX): HIV Screen 4th Generation wRfx: NONREACTIVE

## 2022-06-12 MED ORDER — ALBUTEROL SULFATE HFA 108 (90 BASE) MCG/ACT IN AERS
2.0000 | INHALATION_SPRAY | RESPIRATORY_TRACT | 0 refills | Status: DC | PRN
  Filled 2022-06-12: qty 6.7, 20d supply, fill #0

## 2022-06-12 MED ORDER — PREDNISONE 20 MG PO TABS
40.0000 mg | ORAL_TABLET | Freq: Every day | ORAL | 0 refills | Status: DC
  Filled 2022-06-12: qty 6, 3d supply, fill #0

## 2022-06-12 MED ORDER — BUDESONIDE-FORMOTEROL FUMARATE 160-4.5 MCG/ACT IN AERO
2.0000 | INHALATION_SPRAY | Freq: Two times a day (BID) | RESPIRATORY_TRACT | 12 refills | Status: DC
  Filled 2022-06-12: qty 10.2, 30d supply, fill #0
  Filled 2022-08-26 – 2022-09-06 (×2): qty 10.2, 30d supply, fill #1
  Filled 2022-12-11 – 2023-02-06 (×2): qty 10.2, 30d supply, fill #2

## 2022-06-12 NOTE — Discharge Instructions (Addendum)
Dear Joshua Marks,   Thank you so much for allowing [place] to be part of your care!  You were admitted to [facility] for an asthma exacerbation. You were started on a new daily inhaler and will continue your steroids for 3 more days.   POST-HOSPITAL & CARE INSTRUCTIONS Start taking Symbicort 2 puffs twice daily  Continue steroids until prescription finishes You have an appointment on 1/25 at 9:30am, please call and cancel if you are unable to make this appointment Please let PCP/Specialists know of any changes that were made.  Please see medications section of this packet for any medication changes.   DOCTOR'S APPOINTMENT & FOLLOW UP CARE INSTRUCTIONS  Future Appointments  Date Time Provider [place]  06/14/2022  9:30 AM ACCESS TO CARE POOL [facility] [place]    RETURN PRECAUTIONS: If worsening shortness of breath or wheezing that doesn't improve with inhalers.  Take care and be well!  [facility]  [place], [place] [address] [phone]

## 2022-06-12 NOTE — Progress Notes (Signed)
Discharge instructions given to pt. Pt verbalized understanding of all teaching and had no further questions. Works notes given to pt. Secretary made aware that patient can wait in discharge lounge for his home medications

## 2022-06-12 NOTE — Discharge Summary (Addendum)
[facility] Discharge Summary  Patient name: Joshua Marks Medical record number: [id number] Date of birth: 2000-10-02 Age: 22 y.o. Gender: male Date of Admission: 06/11/2022  Date of Discharge: 06/12/22 Admitting Physician: Zenia Resides, MD  Primary Care Provider: Arlyce Dice, MD Consultants: None  Indication for Hospitalization: Asthma exacerbation  Discharge Diagnoses/Problem List:  Principal Problem for Admission: asthma exacerbation Other Problems addressed during stay:  Principal Problem:   Asthma exacerbation   Brief Hospital Course:  Joshua Marks is a 22 y.o.male with a history of asthma who was admitted to the [facility] Medicine Teaching Service at [facility] for asthma exacerbation. His hospital course is detailed below:   Asthma exacerbation Presented as a transfer from [place] ED for dyspnea and O2 requirement in the setting of asthma exacerbation in the setting of ?viral URI.  Viral panel negative.  Presenting with new O2 requirement of 2 L (no baseline O2 use).  CXR without acute changes.  Received DuoNebs and albuterol and steroids with improvement. Promptly weaned off O2. Upon discharge, was sent home with prednisone and started on Symbicort maintenance inhaler.   PCP Follow-up Recommendations: Started on Symbicort, f/u asthma management   Disposition: home  Discharge Condition: stable, improved   Discharge Exam:  Vitals:   06/12/22 0733 06/12/22 0924  BP:  (!) 122/99  Pulse: 90 63  Resp: 17 17  Temp:  97.8 F (36.6 C)  SpO2: 92% 93%   General: NAD, pleasant, able to participate in exam Cardiac: RRR, no murmurs auscultated Respiratory: CTAB, normal WOB, no wheezes Abdomen: soft, non-tender, non-distended, normoactive bowel sounds Extremities: warm and well perfused, no edema or cyanosis Skin: warm and dry, no rashes noted Neuro: alert, no obvious focal deficits, speech normal Psych: Normal affect and  mood   Significant Procedures: n/a  Significant Labs and Imaging:  Recent Labs  Lab 06/11/22 1250 06/12/22 0609  WBC 12.6* 10.3  HGB 16.6 16.3  HCT 49.0 47.8  PLT 233 251   Recent Labs  Lab 06/11/22 1250 06/12/22 0609  NA 139 138  K 3.9 4.1  CL 103 103  CO2 25 23  GLUCOSE 97 117*  BUN 10 10  CREATININE 1.16 1.13  CALCIUM 9.7 9.5    CXR IMPRESSION: No active cardiopulmonary disease.    Results/Tests Pending at Time of Discharge: n/a  Discharge Medications:  Allergies as of 06/12/2022   No Known Allergies      Medication List     TAKE these medications    albuterol 108 (90 Base) MCG/ACT inhaler Commonly known as: VENTOLIN HFA Inhale 2 puffs into the lungs every 2 (two) hours as needed for wheezing or shortness of breath.   budesonide-formoterol 160-4.5 MCG/ACT inhaler Commonly known as: Symbicort Inhale 2 puffs into the lungs in the morning and at bedtime.   predniSONE 20 MG tablet Commonly known as: DELTASONE Take 2 tablets (40 mg total) by mouth daily with breakfast. Start taking on: June 13, 2022        Discharge Instructions: Please refer to Patient Instructions section of EMR for full details.  Patient was counseled important signs and symptoms that should prompt return to medical care, changes in medications, dietary instructions, activity restrictions, and follow up appointments.   Follow-Up Appointments:  06/14/22 [facility] Access to Care 9:30am  August Albino, MD 06/12/2022, 9:31 AM PGY-1, [place] Upper-Level Resident Addendum   I have independently interviewed and examined the patient. I have discussed  the above with the original author and agree with their documentation. My edits for correction/addition/clarification are in within the document. Please see also any attending notes.   Rise Patience, DO  PGY-3, [place] [facility] 06/12/2022 11:44 AM  FPTS Service pager: [phone] (text pages  welcome through [facility])

## 2022-06-12 NOTE — Hospital Course (Addendum)
Joshua Marks is a 22 y.o.male with a history of asthma who was admitted to the [facility] Teaching Service at [facility] for asthma exacerbation. His hospital course is detailed below:   Asthma exacerbation Presented as a transfer from [place] ED for dyspnea and O2 requirement in the setting of asthma exacerbation in the setting of ?viral URI.  Viral panel negative.  Presenting with new O2 requirement of 2 L (no baseline O2 use).  CXR without acute changes.  Received DuoNebs and albuterol and steroids with improvement. Promptly weaned off O2. Upon discharge, was sent home with prednisone and started on Symbicort maintenance inhaler.   PCP Follow-up Recommendations: Started on Symbicort, f/u asthma management

## 2022-06-12 NOTE — TOC Benefit Eligibility Note (Signed)
Patient Teacher, English as a foreign language completed.    The patient is currently admitted and upon discharge could be taking Symbicort 160-4.5 mcg.  The current 30 day co-pay is $20.00.   The patient is insured through [place], [place] Patient [place] Patient Advocate Team Direct Number: [phone]  Fax: [phone]

## 2022-06-13 ENCOUNTER — Telehealth: Payer: Self-pay

## 2022-06-13 NOTE — Telephone Encounter (Signed)
Transition Care Management Unsuccessful Follow-up Telephone Call  Date of discharge and from where:  [facility] 06/12/2022  Attempts:  1st Attempt  Reason for unsuccessful TCM follow-up call:  No answer/busy Juanda Crumble, [place] Direct Dial [phone]

## 2022-06-14 ENCOUNTER — Ambulatory Visit (INDEPENDENT_AMBULATORY_CARE_PROVIDER_SITE_OTHER): Payer: 59 | Admitting: Family Medicine

## 2022-06-14 VITALS — BP 128/84 | HR 64 | Ht 74.0 in | Wt 208.0 lb

## 2022-06-14 DIAGNOSIS — J4520 Mild intermittent asthma, uncomplicated: Secondary | ICD-10-CM

## 2022-06-14 NOTE — Assessment & Plan Note (Signed)
Hospitalized with asthma exacerbation requiring oxygen and nebulizers. Patient started on maintenance inhaler with Symbicort.  - Continue symbicort  - Discussed return precautions

## 2022-06-14 NOTE — Progress Notes (Signed)
    SUBJECTIVE:   CHIEF COMPLAINT / HPI:   Hospital follow-up - Patient hospitalized 1/22-1/23 for asthma exacerbation - Initially was requiring 2L O2, but was able to quickly wean off oxygen - Discharged home with prednisone and maintenance inhaler (Symbicort) - Patient doing well with no concerns, breathing back at baseline  PERTINENT  PMH / PSH: Reviewed  OBJECTIVE:   BP 128/84   Pulse 64   Ht 6\' 2"  (1.88 m)   Wt 208 lb (94.3 kg)   SpO2 94%   BMI 26.71 kg/m   Gen: well-appearing, NAD CV: RRR, no m/r/g appreciated, no peripheral edema Pulm: mild rhonchi in the RLL, no wheezing, respiratory distress  ASSESSMENT/PLAN:   Asthma Hospitalized with asthma exacerbation requiring oxygen and nebulizers. Patient started on maintenance inhaler with Symbicort.  - Continue symbicort  - Discussed return precautions      Rise Patience, [place]

## 2022-06-14 NOTE — Telephone Encounter (Signed)
Transition Care Management Unsuccessful Follow-up Telephone Call  Date of discharge and from where:  [facility] 06/12/2022  Attempts:  2nd Attempt  Reason for unsuccessful TCM follow-up call:  Left voice message Holbert Caples, LPN [facility] Nurse Health Advisor Direct Dial [phone]    

## 2022-06-14 NOTE — Patient Instructions (Signed)
Everything looks good today. Make sure to take your inhaler as prescribed. If your insurance gives you any issues with filling it (if you use it for your as needed and maintenance inhaler) just let our office know and we can try to get some samples for you.

## 2022-06-19 NOTE — Telephone Encounter (Signed)
Transition Care Management Unsuccessful Follow-up Telephone Call  Date of discharge and from where:  [facility] 06/12/2022  Attempts:  3rd Attempt  Reason for unsuccessful TCM follow-up call:  No answer/busy Juanda Crumble, [place] Direct Dial [phone]

## 2022-07-10 ENCOUNTER — Institutional Professional Consult (permissible substitution): Payer: 59 | Admitting: Emergency Medicine

## 2022-08-16 ENCOUNTER — Institutional Professional Consult (permissible substitution): Payer: 59 | Admitting: Emergency Medicine

## 2022-08-26 ENCOUNTER — Other Ambulatory Visit: Payer: Self-pay | Admitting: Family Medicine

## 2022-08-27 ENCOUNTER — Other Ambulatory Visit: Payer: Self-pay

## 2022-08-27 ENCOUNTER — Other Ambulatory Visit (HOSPITAL_BASED_OUTPATIENT_CLINIC_OR_DEPARTMENT_OTHER): Payer: Self-pay

## 2022-08-27 MED ORDER — ALBUTEROL SULFATE HFA 108 (90 BASE) MCG/ACT IN AERS
2.0000 | INHALATION_SPRAY | RESPIRATORY_TRACT | 0 refills | Status: DC | PRN
  Filled 2022-08-27 – 2022-09-06 (×2): qty 6.7, 20d supply, fill #0

## 2022-09-03 ENCOUNTER — Other Ambulatory Visit (HOSPITAL_BASED_OUTPATIENT_CLINIC_OR_DEPARTMENT_OTHER): Payer: Self-pay

## 2022-09-06 ENCOUNTER — Other Ambulatory Visit (HOSPITAL_BASED_OUTPATIENT_CLINIC_OR_DEPARTMENT_OTHER): Payer: Self-pay

## 2022-12-11 ENCOUNTER — Other Ambulatory Visit (HOSPITAL_BASED_OUTPATIENT_CLINIC_OR_DEPARTMENT_OTHER): Payer: Self-pay

## 2022-12-24 ENCOUNTER — Other Ambulatory Visit (HOSPITAL_BASED_OUTPATIENT_CLINIC_OR_DEPARTMENT_OTHER): Payer: Self-pay

## 2023-02-06 ENCOUNTER — Other Ambulatory Visit: Payer: Self-pay | Admitting: Family Medicine

## 2023-02-06 ENCOUNTER — Other Ambulatory Visit (HOSPITAL_BASED_OUTPATIENT_CLINIC_OR_DEPARTMENT_OTHER): Payer: Self-pay

## 2023-02-11 ENCOUNTER — Other Ambulatory Visit (HOSPITAL_COMMUNITY): Payer: Self-pay

## 2023-02-11 ENCOUNTER — Other Ambulatory Visit (HOSPITAL_BASED_OUTPATIENT_CLINIC_OR_DEPARTMENT_OTHER): Payer: Self-pay

## 2023-02-11 MED ORDER — ALBUTEROL SULFATE HFA 108 (90 BASE) MCG/ACT IN AERS
2.0000 | INHALATION_SPRAY | RESPIRATORY_TRACT | 3 refills | Status: AC | PRN
  Filled 2023-02-11 – 2023-05-20 (×2): qty 6.7, 20d supply, fill #0

## 2023-02-26 ENCOUNTER — Other Ambulatory Visit (HOSPITAL_BASED_OUTPATIENT_CLINIC_OR_DEPARTMENT_OTHER): Payer: Self-pay

## 2023-05-20 ENCOUNTER — Other Ambulatory Visit (HOSPITAL_BASED_OUTPATIENT_CLINIC_OR_DEPARTMENT_OTHER): Payer: Self-pay

## 2023-05-30 ENCOUNTER — Other Ambulatory Visit (HOSPITAL_BASED_OUTPATIENT_CLINIC_OR_DEPARTMENT_OTHER): Payer: Self-pay

## 2023-08-18 ENCOUNTER — Emergency Department (HOSPITAL_COMMUNITY)
Admission: EM | Admit: 2023-08-18 | Discharge: 2023-08-19 | Disposition: A | Discharge status: Home / Self Care | Attending: Emergency Medicine | Admitting: Emergency Medicine

## 2023-08-18 ENCOUNTER — Emergency Department (HOSPITAL_COMMUNITY)

## 2023-08-18 ENCOUNTER — Other Ambulatory Visit: Payer: Self-pay

## 2023-08-18 DIAGNOSIS — S99911A Unspecified injury of right ankle, initial encounter: Secondary | ICD-10-CM | POA: Diagnosis present

## 2023-08-18 DIAGNOSIS — X500XXA Overexertion from strenuous movement or load, initial encounter: Secondary | ICD-10-CM | POA: Diagnosis not present

## 2023-08-18 DIAGNOSIS — S93401A Sprain of unspecified ligament of right ankle, initial encounter: Secondary | ICD-10-CM | POA: Diagnosis not present

## 2023-08-18 NOTE — ED Provider Notes (Signed)
 [place] EMERGENCY DEPARTMENT AT [facility] Provider Note   CSN: [id number] Arrival date & time: 08/18/23  2256     History {Add pertinent medical, surgical, social history, OB history to HPI:1} Chief Complaint  Patient presents with   Ankle Pain    Joshua Marks is a 23 y.o. male.  The history is provided by the patient and medical records.  Ankle Pain  23 year old male presenting to the ED with left ankle pain.  States he finished a lay up and came down wrong on his left foot and rolled his ankle.  Has had ongoing pain since that time.  He is still ambulatory but with a limp.  Denies any prior ankle injuries or surgeries.  No intervention tried prior to arrival.  Home Medications Prior to Admission medications   Medication Sig Start Date End Date Taking? Authorizing Provider  albuterol (VENTOLIN HFA) 108 (90 Base) MCG/ACT inhaler Inhale 2 puffs into the lungs every 2 (two) hours as needed for wheezing or shortness of breath. 02/11/23   Lincoln Brigham, MD  budesonide-formoterol [facility]) 160-4.5 MCG/ACT inhaler Inhale 2 puffs into the lungs in the morning and at bedtime. 06/12/22   Vonna Drafts, MD  predniSONE (DELTASONE) 20 MG tablet Take 2 tablets (40 mg total) by mouth daily with breakfast. 06/13/22   Vonna Drafts, MD      Allergies    Patient has no known allergies.    Review of Systems   Review of Systems  Musculoskeletal:  Positive for arthralgias.  All other systems reviewed and are negative.   Physical Exam Updated Vital Signs BP (!) 156/85 (BP Location: Left Arm)   Pulse 71   Temp 98.4 F (36.9 C) (Oral)   Resp 18   SpO2 97%  Physical Exam Vitals and nursing note reviewed.  Constitutional:      Appearance: He is well-developed.  HENT:     Head: Normocephalic and atraumatic.  Eyes:     Conjunctiva/sclera: Conjunctivae normal.     Pupils: Pupils are equal, round, and reactive to light.  Cardiovascular:     Rate and Rhythm: Normal rate and  regular rhythm.     Heart sounds: Normal heart sounds.  Pulmonary:     Effort: Pulmonary effort is normal.     Breath sounds: Normal breath sounds.  Abdominal:     General: Bowel sounds are normal.     Palpations: Abdomen is soft.  Musculoskeletal:        General: Normal range of motion.     Cervical back: Normal range of motion.     Comments: Left ankle with some swelling along the lateral and anterior aspects, there is no acute deformity, DP pulse intact, moving toes as normal, normal sensation  Skin:    General: Skin is warm and dry.  Neurological:     Mental Status: He is alert and oriented to person, place, and time.     ED Results / Procedures / Treatments   Labs (all labs ordered are listed, but only abnormal results are displayed) Labs Reviewed - No data to display  EKG None  Radiology DG Ankle Left Port Result Date: 08/18/2023 CLINICAL DATA:  [id number] Left ankle injury [id number] EXAM: PORTABLE LEFT ANKLE - 3 VIEW COMPARISON:  09/01/2012. FINDINGS: There is no evidence of fracture, dislocation, or subluxation. There is no evidence of arthropathy or other focal bone abnormality. Soft tissue swelling noted at the lateral malleolus. IMPRESSION: Soft tissue swelling. No acute osseous abnormality identified. Electronically  Signed   By: Layla Maw M.D.   On: 08/18/2023 23:28    Procedures Procedures  {Document cardiac monitor, telemetry assessment procedure when appropriate:1}  Medications Ordered in ED Medications - No data to display  ED Course/ Medical Decision Making/ A&P   {   Click here for ABCD2, HEART and other calculatorsREFRESH Note before signing :1}                              Medical Decision Making Amount and/or Complexity of Data Reviewed Radiology: ordered.   ***  {Document critical care time when appropriate:1} {Document review of labs and clinical decision tools ie heart score, Chads2Vasc2 etc:1}  {Document your independent review of  radiology images, and any outside records:1} {Document your discussion with family members, caretakers, and with consultants:1} {Document social determinants of health affecting pt's care:1} {Document your decision making why or why not admission, treatments were needed:1} Final Clinical Impression(s) / ED Diagnoses Final diagnoses:  None    Rx / DC Orders ED Discharge Orders     None

## 2023-08-18 NOTE — ED Triage Notes (Signed)
 Pt was playing basketball, landed wrong on his left ankle. 8/10 pain.

## 2023-08-19 NOTE — Discharge Instructions (Signed)
 Tylenol or motrin as needed for pain.  Ice and elevate. Can follow-up with Dr. Steward Drone if ongoing issues.  Call for appt. Return here for new concerns.

## 2023-08-29 ENCOUNTER — Other Ambulatory Visit (HOSPITAL_COMMUNITY)
Admission: RE | Admit: 2023-08-29 | Discharge: 2023-08-29 | Disposition: A | Discharge status: Home / Self Care | Source: Ambulatory Visit | Attending: Family Medicine | Admitting: Family Medicine

## 2023-08-29 ENCOUNTER — Ambulatory Visit: Admitting: Family Medicine

## 2023-08-29 VITALS — BP 142/77 | HR 88 | Ht 74.0 in | Wt 239.6 lb

## 2023-08-29 DIAGNOSIS — Z113 Encounter for screening for infections with a predominantly sexual mode of transmission: Secondary | ICD-10-CM

## 2023-08-29 DIAGNOSIS — R030 Elevated blood-pressure reading, without diagnosis of hypertension: Secondary | ICD-10-CM | POA: Diagnosis not present

## 2023-08-29 DIAGNOSIS — J4520 Mild intermittent asthma, uncomplicated: Secondary | ICD-10-CM

## 2023-08-29 MED ORDER — BUDESONIDE-FORMOTEROL FUMARATE 160-4.5 MCG/ACT IN AERO: 2.0000 | INHALATION_SPRAY | Freq: Two times a day (BID) | RESPIRATORY_TRACT | 12 refills | Status: AC

## 2023-08-29 NOTE — Progress Notes (Addendum)
    SUBJECTIVE:   Chief compliant/HPI: annual examination  Joshua Marks is a 23 y.o. who presents today for an annual exam.  - Seen 3/30 in ED for R ankle sprain, XR negative. - Improving, still has pain of 5/10.   STI screening - sexually active with 1 partner. No symptoms.  - Would like screening  Asthma - Has hx of asthma on symbicort  for maintenance - Reports that he is only using prn albuterol  and feels well controlled  Social - Smokes tobacco leaf and weed - Drinks alcohol socially, but stopped 2 weeks ago due to weight gain. - Goes to school currently   OBJECTIVE:   BP (!) 142/77   Pulse 88   Ht 6\' 2"  (1.88 m)   Wt 239 lb 9.6 oz (108.7 kg)   SpO2 95%   BMI 30.76 kg/m   General: Alert, pleasant well-appearing man. NAD. HEENT: NCAT. MMM. CV: RRR, no murmurs.  Resp: CTAB, no wheezing or crackles. Normal WOB on RA.  Abm: Soft, nontender, nondistended. BS present. Ext: Moves all ext spontaneously Skin: Warm, well perfused   ASSESSMENT/PLAN:   Assessment & Plan Mild intermittent asthma without complication - Recommended that patient transition from albuterol  to using Symbicort  prn for asthma exacerbations. -If patient is having more frequent asthma exacerbations, advised patient to resume using Symbicort  as maintenance therapy as well, 2 puffs twice daily -Continue albuterol  as needed as well -Counseled on avoiding smoking if possible Screening examination for STI HIV, RPR, GC/chlamydia Elevated BP without diagnosis of hypertension Intermittently elevated BP at prior visits, elevated today. Will reach out to patient and advise to check BP at home. Consider lifestyle modifications and bmp is persistently high.  Annual Examination  See AVS for age appropriate recommendations  PHQ score 0, reviewed and discussed.   Follow up in 1 year or sooner if indicated.    Albin Huh, MD [facility]

## 2023-08-29 NOTE — Patient Instructions (Signed)
 Good to see you today - Thank you for coming in  Things we discussed today:  1) For your asthma, you can just take your Symbicort instead of your albuterol as your "rescue inhaler". It works longer to prevent asthma attacks. - If you feel like you are having more exacerbations, start taking the symbicort twice a day too.  2) We will check a few labs for HIV, syphilis, gonorrhea, and chlamydia. I will reach out on Mychart with results.  3) Try to avoid smoking if possible. Edibles can be a safer for your lungs than smoking.

## 2023-08-30 LAB — RPR: RPR Ser Ql: NONREACTIVE

## 2023-08-30 LAB — HIV ANTIBODY (ROUTINE TESTING W REFLEX): HIV Screen 4th Generation wRfx: NONREACTIVE

## 2023-08-30 LAB — URINE CYTOLOGY ANCILLARY ONLY
Chlamydia: NEGATIVE
Comment: NEGATIVE
Comment: NORMAL
Neisseria Gonorrhea: NEGATIVE

## 2023-08-30 NOTE — Assessment & Plan Note (Signed)
-   Recommended that patient transition from albuterol to using Symbicort prn for asthma exacerbations. -If patient is having more frequent asthma exacerbations, advised patient to resume using Symbicort as maintenance therapy as well, 2 puffs twice daily -Continue albuterol as needed as well -Counseled on avoiding smoking if possible

## 2023-09-02 ENCOUNTER — Encounter: Payer: Self-pay | Admitting: Family Medicine

## 2023-09-18 ENCOUNTER — Encounter: Payer: Self-pay | Admitting: Family Medicine

## 2023-10-06 ENCOUNTER — Encounter (HOSPITAL_BASED_OUTPATIENT_CLINIC_OR_DEPARTMENT_OTHER): Payer: Self-pay

## 2023-10-06 ENCOUNTER — Emergency Department (HOSPITAL_BASED_OUTPATIENT_CLINIC_OR_DEPARTMENT_OTHER)
Admission: EM | Admit: 2023-10-06 | Discharge: 2023-10-06 | Disposition: A | Discharge status: Home / Self Care | Attending: Emergency Medicine | Admitting: Emergency Medicine

## 2023-10-06 ENCOUNTER — Other Ambulatory Visit: Payer: Self-pay

## 2023-10-06 ENCOUNTER — Telehealth: Admitting: Physician Assistant

## 2023-10-06 DIAGNOSIS — N4889 Other specified disorders of penis: Secondary | ICD-10-CM | POA: Insufficient documentation

## 2023-10-06 DIAGNOSIS — L304 Erythema intertrigo: Secondary | ICD-10-CM | POA: Diagnosis not present

## 2023-10-06 DIAGNOSIS — Z711 Person with feared health complaint in whom no diagnosis is made: Secondary | ICD-10-CM | POA: Diagnosis not present

## 2023-10-06 LAB — URINALYSIS, ROUTINE W REFLEX MICROSCOPIC
Bilirubin Urine: NEGATIVE
Glucose, UA: NEGATIVE mg/dL
Hgb urine dipstick: NEGATIVE
Ketones, ur: NEGATIVE mg/dL
Leukocytes,Ua: NEGATIVE
Nitrite: NEGATIVE
Specific Gravity, Urine: 1.026 (ref 1.005–1.030)
pH: 6 (ref 5.0–8.0)

## 2023-10-06 NOTE — ED Triage Notes (Signed)
 Patient arrives POV with complaints of intermittent penile discomfort/burning. Patient reports it may be related to using powder between his legs for skin irritation.

## 2023-10-06 NOTE — Patient Instructions (Signed)
  Randi Buster, thank you for joining Marciana Settle, PA-C for today's virtual visit.  While this provider is not your primary care provider (PCP), if your PCP is located in our provider database this encounter information will be shared with them immediately following your visit.   A [facility] MyChart account gives you access to today's visit and all your visits, tests, and labs performed at [facility] " click here if you don't have a [facility] MyChart account or go to [email or web address]  Consent: (Patient) Joshua Marks provided verbal consent for this virtual visit at the beginning of the encounter.  Current Medications:  Current Outpatient Medications:    albuterol  (VENTOLIN  HFA) 108 (90 Base) MCG/ACT inhaler, Inhale 2 puffs into the lungs every 2 (two) hours as needed for wheezing or shortness of breath., Disp: 6.7 g, Rfl: 3   budesonide -formoterol  (SYMBICORT ) 160-4.5 MCG/ACT inhaler, Inhale 2 puffs into the lungs in the morning and at bedtime., Disp: 10.2 g, Rfl: 12   Medications ordered in this encounter:  No orders of the defined types were placed in this encounter.    *If you need refills on other medications prior to your next appointment, please contact your pharmacy*  Follow-Up: Call back or seek an in-person evaluation if the symptoms worsen or if the condition fails to improve as anticipated.  [facility] [phone]  Other Instructions Please report to the nearest Emergency room with any worsening symptoms. Follow up with primary care provider (PCP) in 2 -3 days.    If you have been instructed to have an in-person evaluation today at a local Urgent Care facility, please use the link below. It will take you to a list of all of our available [facility], including address, phone number and hours of operation. Please do not delay care.  [facility]  If you or a family member do not have a primary care provider,  use the link below to schedule a visit and establish care. When you choose a [facility] primary care physician or advanced practice provider, you gain a long-term partner in health. Find a Primary Care Provider  Learn more about [place] [facility]'s in-office and virtual care options: [place] - Get Care Now

## 2023-10-06 NOTE — ED Provider Notes (Signed)
 [place] EMERGENCY DEPARTMENT AT [facility] Provider Note   CSN: [id number] Arrival date & time: 10/06/23  1905     History  Chief Complaint  Patient presents with   Groin Pain    Joshua Marks is a 23 y.o. male.  23 year old male presenting with burning around his urethra.  Reports that he was having chafing of his inner thighs, he applied baby powder to the affected area and ever since then he has had some occasional burning around his urethra.  He denies any penile discharge, lesions/ulcers, scrotal tenderness/swelling/erythema, dysuria, fevers.  He denies any new sexual partners, he is sexually active with 1 partner, he does not consistently use protection during intercourse.     Groin Pain       Home Medications Prior to Admission medications   Medication Sig Start Date End Date Taking? Authorizing Provider  albuterol  (VENTOLIN  HFA) 108 (90 Base) MCG/ACT inhaler Inhale 2 puffs into the lungs every 2 (two) hours as needed for wheezing or shortness of breath. 02/11/23   Albin Huh, MD  budesonide -formoterol  (SYMBICORT ) 160-4.5 MCG/ACT inhaler Inhale 2 puffs into the lungs in the morning and at bedtime. 08/29/23   Albin Huh, MD      Allergies    Patient has no known allergies.    Review of Systems   Review of Systems  Physical Exam Updated Vital Signs BP (!) 150/83 (BP Location: Right Arm)   Pulse 92   Temp 99 F (37.2 C)   Resp 16   Ht 6\' 2"  (1.88 m)   Wt 107.5 kg   SpO2 98%   BMI 30.43 kg/m  Physical Exam Vitals and nursing note reviewed.  HENT:     Head: Normocephalic.  Eyes:     Extraocular Movements: Extraocular movements intact.  Cardiovascular:     Rate and Rhythm: Normal rate.  Pulmonary:     Effort: Pulmonary effort is normal.  Genitourinary:    Pubic Area: No rash.      Penis: Normal and uncircumcised. No erythema, tenderness, discharge or lesions.      Testes: Normal.        Right: Mass, tenderness or swelling not present.         Left: Mass, tenderness or swelling not present.     Epididymis:     Right: Normal.     Left: Normal.     Comments: GU exam performed with nurse Arnetta Lank at the bedside as chaperone  Mild skin irritation to both inner thighs consistent with chafing Musculoskeletal:     Cervical back: Normal range of motion.  Lymphadenopathy:     Lower Body: No right inguinal adenopathy. No left inguinal adenopathy.  Skin:    General: Skin is warm and dry.  Neurological:     Mental Status: He is alert.     ED Results / Procedures / Treatments   Labs (all labs ordered are listed, but only abnormal results are displayed) Labs Reviewed  URINALYSIS, ROUTINE W REFLEX MICROSCOPIC - Abnormal; Notable for the following components:      Result Value   Protein, ur TRACE (*)    Bacteria, UA RARE (*)    All other components within normal limits  HIV ANTIBODY (ROUTINE TESTING W REFLEX)  RPR  GC/CHLAMYDIA PROBE AMP ([place]) NOT AT [facility]    EKG None  Radiology No results found.  Procedures Procedures    Medications Ordered in ED Medications - No data to display  ED Course/ Medical Decision Making/  A&P                                 Medical Decision Making This patient presents to the ED for concern of urethral discomfort, this involves an extensive number of treatment options, and is a complaint that carries with it a high risk of complications and morbidity.  The differential diagnosis includes STI including gonorrhea/chlamydia/syphilis/HIV/HSV, urethritis, urinary tract infection   Lab Tests:  I Ordered, and personally interpreted labs.  The pertinent results include: Urinalysis with trace protein and rare bacteria, this is not consistent with a urinary tract infection.  Gonorrhea/chlamydia/HIV/RPR pending upon discharge.   Test / Admission - Considered:  Physical exam largely unremarkable, there was mild irritation of the skin to the patient's inner thighs likely secondary to  chafing.  GU exam was performed with nurse at the bedside as chaperone, there were no lesions/ulcers noted to the penis or urethral meatus, there was no swelling/erythema/masses of the testicles, there was no drainage/discharge from the urethral meatus, there was no inguinal lymphadenopathy.  Overall physical exam is reassuring, urinalysis is negative for active urinary tract infection.  At this time I feel patient is appropriate for discharge, we will contact patient with results of his gonorrhea/chlamydia/HIV/RPR testing, however given his history and reassuring physical exam I feel that these diagnoses are unlikely, therefore I did not preemptively treat him for STIs.  I feel that the patient's irritation around his urethral meatus is likely secondary to application of baby powder to said area, advised patient to avoid application of this directly to the penis/urethra.  Patient voiced understanding and is agreeable with this plan.  Advised patient to follow-up with his primary care provider if symptoms persist.  Return precautions discussed.    Amount and/or Complexity of Data Reviewed Labs: ordered.           Final Clinical Impression(s) / ED Diagnoses Final diagnoses:  Chafing  Pain in the penis    Rx / DC Orders ED Discharge Orders     None         Kendrick Pax, PA-C 10/07/23 0008    Sallyanne Creamer, DO 10/11/23 1132

## 2023-10-06 NOTE — ED Provider Notes (Incomplete)
 [place] EMERGENCY DEPARTMENT AT [facility] Provider Note   CSN: [id number] Arrival date & time: 10/06/23  1905     History {Add pertinent medical, surgical, social history, OB history to HPI:1} Chief Complaint  Patient presents with  . Groin Pain    Joshua Marks is a 23 y.o. male.  23 year old male presenting with burning around his urethra.  Reports that he was having chafing of his inner thighs, he applied baby powder to the affected area and ever since then he has had some occasional burning around his urethra.  He denies any penile discharge, lesions/ulcers, scrotal tenderness/swelling/erythema, dysuria, fevers.  He denies any new sexual partners, he is sexually active with 1 partner, he does not consistently use protection during intercourse.     Groin Pain      Home Medications Prior to Admission medications   Medication Sig Start Date End Date Taking? Authorizing Provider  albuterol  (VENTOLIN  HFA) 108 (90 Base) MCG/ACT inhaler Inhale 2 puffs into the lungs every 2 (two) hours as needed for wheezing or shortness of breath. 02/11/23   Albin Huh, MD  budesonide -formoterol  (SYMBICORT ) 160-4.5 MCG/ACT inhaler Inhale 2 puffs into the lungs in the morning and at bedtime. 08/29/23   Albin Huh, MD      Allergies    Patient has no known allergies.    Review of Systems   Review of Systems  Physical Exam Updated Vital Signs BP (!) 150/83 (BP Location: Right Arm)   Pulse 92   Temp 99 F (37.2 C)   Resp 16   Ht 6\' 2"  (1.88 m)   Wt 107.5 kg   SpO2 98%   BMI 30.43 kg/m  Physical Exam Vitals and nursing note reviewed.  HENT:     Head: Normocephalic.  Eyes:     Extraocular Movements: Extraocular movements intact.  Cardiovascular:     Rate and Rhythm: Normal rate.  Pulmonary:     Effort: Pulmonary effort is normal.  Genitourinary:    Pubic Area: No rash.      Penis: Normal and uncircumcised. No erythema, tenderness, discharge or lesions.      Testes:  Normal.        Right: Mass, tenderness or swelling not present.        Left: Mass, tenderness or swelling not present.     Epididymis:     Right: Normal.     Left: Normal.     Comments: GU exam performed with nurse Arnetta Lank at the bedside as chaperone Musculoskeletal:     Cervical back: Normal range of motion.  Lymphadenopathy:     Lower Body: No right inguinal adenopathy. No left inguinal adenopathy.  Skin:    General: Skin is warm and dry.  Neurological:     Mental Status: He is alert.    ED Results / Procedures / Treatments   Labs (all labs ordered are listed, but only abnormal results are displayed) Labs Reviewed  URINALYSIS, ROUTINE W REFLEX MICROSCOPIC - Abnormal; Notable for the following components:      Result Value   Protein, ur TRACE (*)    Bacteria, UA RARE (*)    All other components within normal limits  HIV ANTIBODY (ROUTINE TESTING W REFLEX)  RPR  GC/CHLAMYDIA PROBE AMP ([place]) NOT AT [facility]    EKG None  Radiology No results found.  Procedures Procedures  {Document cardiac monitor, telemetry assessment procedure when appropriate:1}  Medications Ordered in ED Medications - No data to display  ED Course/  Medical Decision Making/ A&P   {   Click here for ABCD2, HEART and other calculatorsREFRESH Note before signing :1}                              Medical Decision Making Amount and/or Complexity of Data Reviewed Labs: ordered.   ***  {Document critical care time when appropriate:1} {Document review of labs and clinical decision tools ie heart score, Chads2Vasc2 etc:1}  {Document your independent review of radiology images, and any outside records:1} {Document your discussion with family members, caretakers, and with consultants:1} {Document social determinants of health affecting pt's care:1} {Document your decision making why or why not admission, treatments were needed:1} Final Clinical Impression(s) / ED Diagnoses Final diagnoses:   None    Rx / DC Orders ED Discharge Orders     None

## 2023-10-06 NOTE — Discharge Instructions (Signed)
 Your symptoms are likely secondary to chafing, you can continue to apply baby powder to the affected area or [employer] invisible body powder spray for chafing.  Avoid application of these products directly to the penis/urethra.  Follow-up with your primary care provider if symptoms persist.  We will contact you with any positive results from your lab work.

## 2023-10-06 NOTE — Progress Notes (Signed)
 Virtual Visit Consent   Joshua Marks, you are scheduled for a virtual visit with a [facility] provider today. Just as with appointments in the office, your consent must be obtained to participate. Your consent will be active for this visit and any virtual visit you may have with one of our providers in the next 365 days. If you have a MyChart account, a copy of this consent can be sent to you electronically.  As this is a virtual visit, video technology does not allow for your provider to perform a traditional examination. This may limit your provider's ability to fully assess your condition. If your provider identifies any concerns that need to be evaluated in person or the need to arrange testing (such as labs, EKG, etc.), we will make arrangements to do so. Although advances in technology are sophisticated, we cannot ensure that it will always work on either your end or our end. If the connection with a video visit is poor, the visit may have to be switched to a telephone visit. With either a video or telephone visit, we are not always able to ensure that we have a secure connection.  By engaging in this virtual visit, you consent to the provision of healthcare and authorize for your insurance to be billed (if applicable) for the services provided during this visit. Depending on your insurance coverage, you may receive a charge related to this service.  I need to obtain your verbal consent now. Are you willing to proceed with your visit today? Joshua Marks has provided verbal consent on 10/06/2023 for a virtual visit (video or telephone). Joshua Marks, [place]  Date: 10/06/2023 2:19 PM   Virtual Visit via Video Note   I, Joshua Marks, connected with  Joshua Marks  ([id number], 03-Jul-2000) on 10/06/23 at  2:15 PM EDT by a video-enabled telemedicine application and verified that I am speaking with the correct person using two identifiers.  Location: Patient: Virtual Visit Location Patient:  Home Provider: Virtual Visit Location Provider: Home Office   I discussed the limitations of evaluation and management by telemedicine and the availability of in person appointments. The patient expressed understanding and agreed to proceed.    History of Present Illness: Joshua Marks is a 23 y.o. who identifies as a male who was assigned male at birth, and is being seen today for eye concern.  HPI: Eye Problem  The right eye is affected. This is a new problem. The current episode started yesterday. The problem has been resolved. There was no injury mechanism. The pain is at a severity of 0/10. The patient is experiencing no pain. There is No known exposure to pink eye. He Does not wear contacts. Pertinent negatives include no blurred vision, eye discharge, double vision, eye redness, fever, foreign body sensation, itching, nausea, photophobia, recent URI or vomiting. He has tried nothing for the symptoms.    Problems:  Patient Active Problem List   Diagnosis Date Noted   Alopecia areata 03/28/2021   Unprotected sex 09/23/2020   Asthma 05/09/2020   Elevated serum creatinine 05/09/2020    Allergies: No Known Allergies Medications:  Current Outpatient Medications:    albuterol  (VENTOLIN  HFA) 108 (90 Base) MCG/ACT inhaler, Inhale 2 puffs into the lungs every 2 (two) hours as needed for wheezing or shortness of breath., Disp: 6.7 g, Rfl: 3   budesonide -formoterol  (SYMBICORT ) 160-4.5 MCG/ACT inhaler, Inhale 2 puffs into the lungs in the morning and at bedtime., Disp: 10.2 g, Rfl: 12  Observations/Objective: Patient is  well-developed, well-nourished in no acute distress.  Resting comfortably  at home.  Head is normocephalic, atraumatic.  No labored breathing.  Speech is clear and coherent with logical content.  Patient is alert and oriented at baseline.    Assessment and Plan: 1. Concern about eye disease without diagnosis (Primary)  No obvious abnormality on exam. Advised patient to  continue monitoring his symptoms and should they worsen or he develop eye pain, photophobia, blurred vision, he should present in person for an assessment. Verbalized understanding and agrees with this plan.   Follow Up Instructions: I discussed the assessment and treatment plan with the patient. The patient was provided an opportunity to ask questions and all were answered. The patient agreed with the plan and demonstrated an understanding of the instructions.  A copy of instructions were sent to the patient via MyChart unless otherwise noted below.  The patient was advised to call back or seek an in-person evaluation if the symptoms worsen or if the condition fails to improve as anticipated.    Joshua Settle, PA-C

## 2023-10-07 LAB — RPR: RPR Ser Ql: NONREACTIVE

## 2023-10-08 LAB — HIV ANTIBODY (ROUTINE TESTING W REFLEX): HIV Screen 4th Generation wRfx: NONREACTIVE

## 2023-10-08 LAB — GC/CHLAMYDIA PROBE AMP (~~LOC~~) NOT AT ARMC
Chlamydia: NEGATIVE
Comment: NEGATIVE
Comment: NORMAL
Neisseria Gonorrhea: NEGATIVE

## 2023-10-08 LAB — MISC LABCORP TEST (SEND OUT): Labcorp test code: 83935

## 2023-10-11 ENCOUNTER — Ambulatory Visit (INDEPENDENT_AMBULATORY_CARE_PROVIDER_SITE_OTHER): Admitting: Family Medicine

## 2023-10-11 ENCOUNTER — Ambulatory Visit (HOSPITAL_COMMUNITY)
Admission: EM | Admit: 2023-10-11 | Discharge: 2023-10-11 | Disposition: A | Discharge status: Home / Self Care | Attending: Psychiatry | Admitting: Psychiatry

## 2023-10-11 VITALS — BP 136/74 | HR 71 | Ht 74.0 in | Wt 234.0 lb

## 2023-10-11 DIAGNOSIS — F419 Anxiety disorder, unspecified: Secondary | ICD-10-CM | POA: Diagnosis not present

## 2023-10-11 DIAGNOSIS — F411 Generalized anxiety disorder: Secondary | ICD-10-CM

## 2023-10-11 DIAGNOSIS — L304 Erythema intertrigo: Secondary | ICD-10-CM | POA: Insufficient documentation

## 2023-10-11 DIAGNOSIS — Q386 Other congenital malformations of mouth: Secondary | ICD-10-CM | POA: Diagnosis not present

## 2023-10-11 DIAGNOSIS — R45851 Suicidal ideations: Secondary | ICD-10-CM

## 2023-10-11 NOTE — Assessment & Plan Note (Addendum)
 Feel mild erythema and irritation of the inner thighs likely due to chafing.  Advised to stop using lotion and to try antichafing balm they can obtain over-the-counter to help reduce friction and moisture.  Follow-up as needed.

## 2023-10-11 NOTE — Assessment & Plan Note (Addendum)
 Discussed concerns with patient at length.  He does endorse passive thoughts of SI and did report some thoughts of using a gun to hurt himself in the past.  While the thoughts are not strong today, he did endorse they were there today.  PHQ-9 was also positive.  He has a great support system and his mother and girlfriend, he does not have access to firearms.  Discussed with him having evaluation at [facility] to get connected with a psychiatrist and resources quickly.  Patient was very respectful and had a great insight into his condition; he presented to [facility] and was evaluated and connected with psychiatric resources.  Offered my ongoing support for him whenever he needs.

## 2023-10-11 NOTE — ED Provider Notes (Cosign Needed)
 Behavioral Health Urgent Care Medical Screening Exam  Patient Name: Joshua Marks MRN: [id number] Date of Evaluation: 10/11/23 Chief Complaint:   Diagnosis:  Final diagnoses:  Anxiety state  Passive suicidal ideations    History of Present illness: Joshua Marks is a 23 y.o. male with no formal past psychiatric history reporting a 1 week history of worsening anxiety and passive suicidal ideations.  He attributes his worsening mood to concerns about potentially contracting an STI after his best friend was recently diagnosed with one.  He reports significant anxiety centered around his fear, which he reports has led to increased rumination and a sense of dread that "something bad is going to happen".  The patient denies any prior psychiatric diagnoses, suicide attempts, or history of self-injurious behaviors.  He has never been hospitalized psychiatrically and has never trialed psychotropic medications.  He describes the suicidal thoughts as passive, with no current intent or plan, and states that he does not believe he would act on them.  His primary reasons for living include his family and a desire to support his friends.  He reports longstanding anxiety, describing himself as someone who "over thinks everything".  He identifies anxiety as a primary issue.  He denies hallucinations or delusional thinking.  The patient reports he lives with his girlfriend, who he describes as supportive.  Additional social support includes his mother and grandparents.  The patient expressed interest in establishing care with a psychiatrist for medication management.  A referral to [facility] behavioral health outpatient clinic in [place] was provided   [facility] ED from 10/11/2023 in [facility] ED from 08/18/2023 in [facility] Emergency Department at [facility] ED to Hosp-Admission (Discharged) from 06/11/2022 in [facility]  SURGICAL  C-SSRS RISK  CATEGORY No Risk No Risk No Risk       Psychiatric Specialty Exam  Presentation  General Appearance:Appropriate for Environment  Eye Contact:Good  Speech:Clear and Coherent; Normal Rate  Speech Volume:Normal    Mood and Affect  Mood:Euthymic  Affect:Congruent; Full Range   Thought Process  Thought Processes:Linear  Descriptions of Associations:Intact  Orientation:None  Thought Content:Logical    Hallucinations:None  Ideas of Reference:None  Suicidal Thoughts:No  Homicidal Thoughts:No   Sensorium  Memory:Immediate Good; Recent Good; Remote Good  Judgment:Fair  Insight:Fair   Executive Functions  Concentration:Good  Attention Span:Good  Recall:Good  Fund of Knowledge:Good  Language:Good   Psychomotor Activity  Psychomotor Activity:Normal   Assets  Assets:Desire for Improvement; Resilience; Communication Skills   Sleep  Sleep:Fair   Physical Exam: Physical Exam Vitals and nursing note reviewed.  Constitutional:      General: He is not in acute distress.    Appearance: He is not ill-appearing.  HENT:     Head: Normocephalic and atraumatic.  Eyes:     Extraocular Movements: Extraocular movements intact.     Conjunctiva/sclera: Conjunctivae normal.  Pulmonary:     Effort: Pulmonary effort is normal. No respiratory distress.  Musculoskeletal:        General: Normal range of motion.  Skin:    General: Skin is warm and dry.  Neurological:     General: No focal deficit present.    Review of Systems  All other systems reviewed and are negative.  Blood pressure 139/82, pulse 77, temperature 98.9 F (37.2 C), temperature source Oral, resp. rate 19, SpO2 99%. There is no height or weight on file to calculate BMI.   [facility] MSE Discharge Disposition for  Follow up and Recommendations: Based on my evaluation the patient does not appear to have an emergency medical condition and can be discharged with resources and follow up care in  outpatient services for Medication Management   Baltazar Bonier, MD 10/11/2023, 1:14 PM

## 2023-10-11 NOTE — Patient Instructions (Addendum)
 You have a Fordyce spot on your penis which is a collection of oils. This is not an STD. You do not have evidence of that. You do have some chafing, and we need to get you some anti-chafing balm (can find at the drug store). This helps reduce friction and dries the area. I would avoid lotions there for now.  Be sure to go to the [facility] at [address], [place], [place] [address] Phone: [phone]. Please be sure to go right after this appointment! You got this! You are so strong, and they will be sure to get you resources to feel better.
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
## 2023-10-11 NOTE — Assessment & Plan Note (Addendum)
 Singular lesion noted on the foreskin.  No evidence of STIs.  Advised of self-limited nature.

## 2023-10-11 NOTE — Discharge Summary (Signed)
 Joshua Marks to be discharged Home per MD order. Discussed with the patient and all questions fully answered. An After Visit Summary was printed and given to the patient. All belongings returned. Patient escorted out and discharged home via private auto.  Minus [name]  10/11/2023 1:06 PM

## 2023-10-11 NOTE — ED Provider Notes (Incomplete)
  Patient reports he has been going through a lot, his best friend was diagnosed with an STI recently He rpeorts growing ncreasingly anxious that he might have it, it is causing significant anxiety  No prior suicde hx, Nssib hx is ngeative  Si is passive, intent or plan, doe snot believe he would act on it.   Patient's reaosn for living is his family, wants to live, being there for his friends  He feels he overthinks everything, he feels hix=s anxiety is the main.   He has itense fear of something bad happening  Patient lives in [place] with his girlfriend, who he reports is upportive   He reports he has never been formally diagnosed or had a psychitric hospitalization. He has never tried medications for anxiety.  [employer]    Social support: mother is uspportive, grandparents  Worsening dperession
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
## 2023-10-11 NOTE — Discharge Instructions (Addendum)
 Dear Joshua Marks,  It was a pleasure to take care of you during your stay at [facility]) where you were treated for your mental health concerns.   While you were here, you were:  provided resources by our social workers and case managers  Please review the medication list provided to you at discharge and stop, start taking, or continue taking the medications listed there.  You should also follow-up with your primary care doctor, or start seeing one if you don't have one yet. If applicable, here are some scheduled follow-ups for you:  We were unable to arrange an appointment at this visit but have sent a refferal to [facility]  Staff from this clinic will reach out to you to arrange an appointment This is the address: Address: [address], [place], [place] [address] Phone: [phone]   I recommend abstinence from alcohol, tobacco, and other illicit drug use.   If your psychiatric symptoms or suicidal thoughts recur, worsen, or if you have side effects to your psychiatric medications, call your outpatient psychiatric provider, 911, [phone] or go to the nearest emergency department.  Take care!  Signed: Baltazar Bonier, MD 10/11/2023, 12:48 PM  Naloxone (Narcan) can help reverse an overdose when given to the victim quickly.  [place] offers free naloxone kits and instructions/training on its use.  Add naloxone to your first aid kit and you can help save a life. A prescription can be filled at your local pharmacy or free kits are provided by the county

## 2023-10-17 NOTE — Progress Notes (Signed)
    SUBJECTIVE:   CHIEF COMPLAINT / HPI:   Anxiety follow-up At his last visit last week, he reported thoughts of passive SI.  PHQ-9 #9 was positive at that time.  He was recommended to go to [facility] for swift evaluation.  He was given resources to get connected with outpatient psychiatry and medication management.  PERTINENT  PMH / PSH: Chafing, asthma  OBJECTIVE:   There were no vitals taken for this visit.  General: Alert and oriented, in NAD Skin: Warm, dry, and intact without lesions HEENT: NCAT, EOM grossly normal, midline nasal septum Cardiac: RRR, no m/r/g appreciated Respiratory: CTAB, breathing and speaking comfortably on RA Abdominal: Soft, nontender, nondistended, normoactive bowel sounds Extremities: Moves all extremities grossly equally Neurological: No gross focal deficit Psychiatric: Appropriate mood and affect  ASSESSMENT/PLAN:   Assessment & Plan      Joshua Kenning, MD [facility]

## 2023-10-18 ENCOUNTER — Encounter: Payer: Self-pay | Admitting: Family Medicine

## 2023-10-18 ENCOUNTER — Ambulatory Visit: Admitting: Family Medicine

## 2023-10-18 VITALS — BP 125/83 | HR 78 | Ht 74.0 in | Wt 237.2 lb

## 2023-10-18 DIAGNOSIS — F419 Anxiety disorder, unspecified: Secondary | ICD-10-CM | POA: Diagnosis not present

## 2023-10-18 MED ORDER — FLUOXETINE HCL 20 MG PO TABS: 20.0000 mg | ORAL_TABLET | Freq: Every day | ORAL | 0 refills | Status: DC

## 2023-10-18 NOTE — Assessment & Plan Note (Signed)
 Persistent.  Reassuringly PHQ-9 reduced from prior.  GAD-7 understandably elevated.  Question some component of PTSD given patient's trauma history.  He has follow-up with psychiatry next week.  In the meantime, discussed starting medications; will begin Prozac 20 mg daily for now.  Advised to let me know if he has any symptoms of mania, increasing anxiety/depression, or intolerable sexual side effects.  Follow-up with me in 1 month or sooner if needed.

## 2023-10-18 NOTE — Patient Instructions (Signed)
 I have sent in Prozac to take daily for your moods. Let me know if you have any symptoms like feeling on top of the world, more anxious/depressed, or have sexual side effects that are not tolerable.  Be sure to follow up with your psychiatrist this Wednesday. Come back and see me in 1 month or sooner if needed.

## 2023-10-23 ENCOUNTER — Encounter (HOSPITAL_COMMUNITY): Payer: Self-pay

## 2023-10-23 ENCOUNTER — Ambulatory Visit (HOSPITAL_COMMUNITY): Admitting: Psychiatry

## 2023-11-08 ENCOUNTER — Encounter (HOSPITAL_COMMUNITY): Payer: Self-pay

## 2023-11-08 ENCOUNTER — Ambulatory Visit (HOSPITAL_COMMUNITY): Admitting: Psychiatry

## 2023-11-11 ENCOUNTER — Other Ambulatory Visit: Payer: Self-pay

## 2023-11-11 MED ORDER — FLUOXETINE HCL 20 MG PO TABS: 20.0000 mg | ORAL_TABLET | Freq: Every day | ORAL | 1 refills | Status: AC
